# Patient Record
Sex: Male | Born: 1995 | Race: Black or African American | Hispanic: No | Marital: Single | Smoking: Never smoker
Health system: Southern US, Community
[De-identification: ages and names within clinical notes are randomized; demographics above are authoritative.]

---

## 2019-03-11 ENCOUNTER — Other Ambulatory Visit: Payer: Self-pay

## 2019-03-11 ENCOUNTER — Encounter (HOSPITAL_COMMUNITY): Payer: Self-pay | Admitting: *Deleted

## 2019-03-11 ENCOUNTER — Emergency Department (HOSPITAL_COMMUNITY)
Admission: EM | Admit: 2019-03-11 | Discharge: 2019-03-11 | Disposition: A | Discharge status: Home / Self Care | Payer: Self-pay | Attending: Emergency Medicine | Admitting: Emergency Medicine

## 2019-03-11 ENCOUNTER — Emergency Department (HOSPITAL_COMMUNITY): Payer: Self-pay

## 2019-03-11 DIAGNOSIS — J069 Acute upper respiratory infection, unspecified: Secondary | ICD-10-CM | POA: Insufficient documentation

## 2019-03-11 DIAGNOSIS — R509 Fever, unspecified: Secondary | ICD-10-CM | POA: Insufficient documentation

## 2019-03-11 DIAGNOSIS — R0789 Other chest pain: Secondary | ICD-10-CM | POA: Insufficient documentation

## 2019-03-11 LAB — BASIC METABOLIC PANEL
Anion gap: 12 (ref 5–15)
BUN: 9 mg/dL (ref 6–20)
CO2: 22 mmol/L (ref 22–32)
Calcium: 9.2 mg/dL (ref 8.9–10.3)
Chloride: 103 mmol/L (ref 98–111)
Creatinine, Ser: 0.74 mg/dL (ref 0.61–1.24)
GFR calc Af Amer: >60 mL/min (ref >60)
GFR calc non Af Amer: >60 mL/min (ref >60)
Glucose, Bld: 110 mg/dL — ABNORMAL HIGH (ref 70–99)
Potassium: 4.5 mmol/L (ref 3.5–5.1)
Sodium: 137 mmol/L (ref 135–145)

## 2019-03-11 LAB — CBC WITH DIFFERENTIAL/PLATELET
Abs Immature Granulocytes: 0.02 10*3/uL (ref 0.00–0.07)
Basophils Absolute: 0 10*3/uL (ref 0.0–0.1)
Basophils Relative: 0 %
Eosinophils Absolute: 0 10*3/uL (ref 0.0–0.5)
Eosinophils Relative: 0 %
HCT: 46.2 % (ref 39.0–52.0)
Hemoglobin: 15 g/dL (ref 13.0–17.0)
Immature Granulocytes: 1 %
Lymphocytes Relative: 27 %
Lymphs Abs: 1.2 10*3/uL (ref 0.7–4.0)
MCH: 28.9 pg (ref 26.0–34.0)
MCHC: 32.5 g/dL (ref 30.0–36.0)
MCV: 89 fL (ref 80.0–100.0)
Monocytes Absolute: 0.7 10*3/uL (ref 0.1–1.0)
Monocytes Relative: 15 %
Neutro Abs: 2.6 10*3/uL (ref 1.7–7.7)
Neutrophils Relative %: 57 %
Platelets: 161 10*3/uL (ref 150–400)
RBC: 5.19 MIL/uL (ref 4.22–5.81)
RDW: 12.9 % (ref 11.5–15.5)
WBC: 4.4 10*3/uL (ref 4.0–10.5)
nRBC: 0 % (ref 0.0–0.2)

## 2019-03-11 LAB — D-DIMER, QUANTITATIVE: D-Dimer, Quant: 0.3 ug/mL-FEU (ref 0.00–0.50)

## 2019-03-11 MED ORDER — DOXYCYCLINE HYCLATE 100 MG PO CAPS: 100.0000 mg | ORAL_CAPSULE | Freq: Two times a day (BID) | ORAL | 0 refills | Status: DC

## 2019-03-11 MED ORDER — SODIUM CHLORIDE 0.9 % IV BOLUS
500.0000 mL | Freq: Once | INTRAVENOUS | Status: AC
  Administered 2019-03-11: 500 mL via INTRAVENOUS

## 2019-03-11 MED ORDER — ACETAMINOPHEN 325 MG PO TABS: 650.0000 mg | ORAL_TABLET | Freq: Four times a day (QID) | ORAL | 0 refills | Status: DC | PRN

## 2019-03-11 MED ORDER — ACETAMINOPHEN 325 MG PO TABS
650.0000 mg | ORAL_TABLET | Freq: Once | ORAL | Status: AC
  Administered 2019-03-11: 650 mg via ORAL
  Filled 2019-03-11: qty 2

## 2019-03-11 MED ORDER — IBUPROFEN 600 MG PO TABS: 600.0000 mg | ORAL_TABLET | Freq: Four times a day (QID) | ORAL | 0 refills | Status: DC | PRN

## 2019-03-11 MED ORDER — ALBUTEROL SULFATE HFA 108 (90 BASE) MCG/ACT IN AERS
6.0000 | INHALATION_SPRAY | Freq: Once | RESPIRATORY_TRACT | Status: AC
  Administered 2019-03-11: 6 via RESPIRATORY_TRACT
  Filled 2019-03-11: qty 6.7

## 2019-03-11 NOTE — Discharge Instructions (Addendum)
You were given a prescription for antibiotics. Please take the antibiotic prescription fully.   Take two puff of the albuterol inhaler as needed for cough or shortness of breath.  Rotate tylenol and motrin for fevers.  Stay well hydrated.  You should be isolated for at least 7 days since the onset of your symptoms AND >72 hours after symptoms resolution (absence of fever without the use of fever reducing medication and improvement in respiratory symptoms), whichever is longer  Please follow up with your primary care provider within 5-7 days for re-evaluation of your symptoms. If you do not have a primary care provider, information for a healthcare clinic has been provided for you to make arrangements for follow up care. Please return to the emergency department for any new or worsening symptoms.  --------------------------------------------------------------------------------  Gary Fleet imiti ya antibiotike. [place] fata antibiyotike yuzuye.  Fata puffe ebyiri zo [place] albuterol nkuko bikenewe kugirango inkorora cyangwa [place]a neza.  Kuzenguruka tylenol na motrin Tia Alert.  Gumana amazi meza.  Ugomba kwigunga byibuze iminsi 7 kuva ibimenyetso byawe bitangiye KANDI> amasaha 59 nyuma yo [place] ibimenyetso ([place] umuriro udakoresheje umuriro [place] imiti no kunoza ibimenyetso byubuhumekero), ibyo bikaba birebire  [place] kurikirana nubuvuzi bwibanze mugihe cyiminsi 5-7 kugirango wongere usuzume ibimenyetso byawe. Niba udafite ubuvuzi bwibanze, amakuru yivuriro ryatanzwe [place] yo gukurikirana ubuvuzi. [place] subira mu ishami ryihutirwa kubimenyetso byose bishya cyangwa bibi.

## 2019-03-11 NOTE — ED Provider Notes (Signed)
[facility] EMERGENCY DEPARTMENT Provider Note   CSN: [id number] Arrival date & time: 03/11/19  1231    History   Chief Complaint Chief Complaint  Patient presents with  . Shortness of Breath    Phone interpretor was used throughout this evaluation.  HPI Jon Yates is a 23 y.o. male.     HPI   Patient is a 23 year old male with no significant past medical history presents the emergency department today for evaluation of cough, hemoptysis, chest pain/heaviness, pain with inspiration, shortness of breath, fatigue, and generalized weakness starting 5 days ago.  States that the cough has been intermittent.  States he has had hemoptysis at least 3 times this week.  Reports this chest pain/heaviness is on the left side of the chest.  It is made worse with inspiration.  He also states that he has lost his sense of taste and smell.  He denies any known fevers.  Has had some mild rhinorrhea/nasal congestion but attributes it to allergies.  He denies any lower extremity swelling or calf pain.  No recent periods of extended travel.  No recent hospital admissions or surgeries.  No history of VTE.  Denies any known sick contacts or known COVID exposures.  Patient moved here from [place] in October, states he is not up-to-date on vaccinations.  History reviewed. No pertinent past medical history.  There are no active problems to display for this patient.   History reviewed. No pertinent surgical history.    Home Medications    Prior to Admission medications   Medication Sig Start Date End Date Taking? Authorizing Provider  acetaminophen (TYLENOL) 325 MG tablet Take 2 tablets (650 mg total) by mouth every 6 (six) hours as needed. Do not take more than 4000mg  of tylenol per day 03/11/19   Melene Plan, DO  doxycycline (VIBRAMYCIN) 100 MG capsule Take 1 capsule (100 mg total) by mouth 2 (two) times daily for 7 days. 03/11/19 03/18/19  Melene Plan, DO  ibuprofen (ADVIL) 600  MG tablet Take 1 tablet (600 mg total) by mouth every 6 (six) hours as needed. 03/11/19   Melene Plan, DO    Family History No family history on file.  Social History Social History   Tobacco Use  . Smoking status: Never Smoker  . Smokeless tobacco: Never Used  Substance Use Topics  . Alcohol use: Yes  . Drug use: Never     Allergies   Patient has no allergy information on record.   Review of Systems Review of Systems  Constitutional: Positive for fatigue. Negative for fever.       Anosmia  HENT: Positive for congestion and rhinorrhea. Negative for sore throat.   Eyes: Negative for visual disturbance.  Respiratory: Positive for cough and shortness of breath.   Cardiovascular: Positive for chest pain. Negative for leg swelling.  Gastrointestinal: Negative for abdominal pain, constipation, diarrhea, nausea and vomiting.  Genitourinary: Negative for dysuria and hematuria.  Musculoskeletal: Negative for myalgias.  Skin: Negative for rash.  Neurological: Negative for headaches.  All other systems reviewed and are negative.    Physical Exam Updated Vital Signs BP 118/87 (BP Location: Right Arm)   Pulse 99   Temp (!) 101.1 F (38.4 C) (Oral)   Resp 16   Ht 5\' 5"  (1.651 m)   Wt 99.8 kg   SpO2 97%   BMI 36.61 kg/m   Physical Exam Vitals signs and nursing note reviewed.  Constitutional:      Appearance: He  is well-developed.  HENT:     Head: Normocephalic and atraumatic.  Eyes:     Conjunctiva/sclera: Conjunctivae normal.  Neck:     Musculoskeletal: Neck supple.  Cardiovascular:     Rate and Rhythm: Regular rhythm. Tachycardia present.     Heart sounds: Normal heart sounds. No murmur.  Pulmonary:     Effort: Pulmonary effort is normal. No respiratory distress.     Breath sounds: Rhonchi present. No decreased breath sounds, wheezing or rales.     Comments: Wet cough on exam. Tachypneic, but able to speak in full sentences. Abdominal:     General: Bowel sounds  are normal.     Palpations: Abdomen is soft.     Tenderness: There is no abdominal tenderness.  Musculoskeletal:     Right lower leg: He exhibits no tenderness. No edema.     Left lower leg: He exhibits no tenderness. No edema.  Skin:    General: Skin is warm and dry.  Neurological:     Mental Status: He is alert.      ED Treatments / Results  Labs (all labs ordered are listed, but only abnormal results are displayed) Labs Reviewed  BASIC METABOLIC PANEL - Abnormal; Notable for the following components:      Result Value   Glucose, Bld 110 (*)    All other components within normal limits  CBC WITH DIFFERENTIAL/PLATELET  D-DIMER, QUANTITATIVE (NOT AT [facility])    EKG EKG Interpretation  Date/Time:  Friday March 11 2019 12:45:01 EDT Ventricular Rate:  97 PR Interval:    QRS Duration: 77 QT Interval:  312 QTC Calculation: 397 R Axis:   56 Text Interpretation:  Sinus rhythm No old tracing to compare Confirmed by Melene PlanFloyd, Dan [phone]([id number]) on 03/11/2019 1:30:05 PM   Radiology Dg Chest Portable 1 View  Result Date: 03/11/2019 CLINICAL DATA:  Shortness of breath with cough and fever EXAM: PORTABLE CHEST 1 VIEW COMPARISON:  None. FINDINGS: There is subtle increased opacity in the right base. The lungs elsewhere are clear. Heart size and pulmonary vascularity are normal. No adenopathy. No bone lesions. IMPRESSION: Subtle increased opacity in the right base, suspicious for early pneumonia. Lungs elsewhere clear. No adenopathy. Electronically Signed   By: Bretta BangWilliam  Woodruff III M.D.   On: 03/11/2019 14:13    Procedures Procedures (including critical care time)  Medications Ordered in ED Medications  acetaminophen (TYLENOL) tablet 650 mg (650 mg Oral Given 03/11/19 1425)  albuterol (VENTOLIN HFA) 108 (90 Base) MCG/ACT inhaler 6 puff (6 puffs Inhalation Given 03/11/19 1425)  sodium chloride 0.9 % bolus 500 mL (0 mLs Intravenous Stopped 03/11/19 1500)     Initial Impression / Assessment and  Plan / ED Course  I have reviewed the triage vital signs and the nursing notes.  Pertinent labs & imaging results that were available during my care of the patient were reviewed by me and considered in my medical decision making (see chart for details).     Final Clinical Impressions(s) / ED Diagnoses   Final diagnoses:  Upper respiratory tract infection, unspecified type   Pt presenting with cough, blood in sputum, and sob x1 week.   On arrival, pt noted to be febrile to 101F. Also with marginal tachycardia and tachypnea. Sats are WNL and BP is stable.  No risk factors for VTE. No known covid contacts.   Lungs CTAB, but pt has wet cough on exam. Mild tachypnea, but able to speak in complete sentences. No peripheral edema. Will obtain  labs including ddimer and obtain CXR. EKG completed from triage.  CBC is without leukocytosis, no anemia present BMP with normal electrolytes and kidney function Ddimer is negative making PE much less likely  EKG with NSR, no ischemic changes  CXR with subtle increased opacity in the right base, suspicious for early pneumonia  Given the finding on his CXR I will give him an rx for doxycycline to cover possible bacterial pneumonia. I do have increased suspicion for COVID in this patient and we discussed this and the need for him to self quarantine. Pt given tylenol, IVF, and albuterol inhaler. He states his sob and cp/tightness have improved on re-eval. Pt continues to be able to speak in full sentences throughout my interview and is in no distress. I will give him rx for doxy, albuterol and antipyretics. Gave strict instructions on return precautions. He voices understanding and is in agreement with plan. All questions answered. Pt stable for d/c.  Jon Yates was evaluated in Emergency Department on 03/12/2019 for the symptoms described in the history of present illness. He was evaluated in the context of the global COVID-19 pandemic, which  necessitated consideration that the patient might be at risk for infection with the SARS-CoV-2 virus that causes COVID-19. Institutional protocols and algorithms that pertain to the evaluation of patients at risk for COVID-19 are in a state of rapid change based on information released by regulatory bodies including the CDC and federal and state organizations. These policies and algorithms were followed during the patient's care in the ED.   ED Discharge Orders         Ordered    doxycycline (VIBRAMYCIN) 100 MG capsule  2 times daily,   Status:  Discontinued     03/11/19 1603    acetaminophen (TYLENOL) 325 MG tablet  Every 6 hours PRN,   Status:  Discontinued     03/11/19 1603    ibuprofen (ADVIL) 600 MG tablet  Every 6 hours PRN,   Status:  Discontinued     03/11/19 1603    acetaminophen (TYLENOL) 325 MG tablet  Every 6 hours PRN     03/11/19 1712    doxycycline (VIBRAMYCIN) 100 MG capsule  2 times daily     03/11/19 1712    ibuprofen (ADVIL) 600 MG tablet  Every 6 hours PRN     03/11/19 1712           Sarann Tregre S, PA-C 03/12/19 0845    Melene Plan, DO 03/12/19 1117

## 2019-03-11 NOTE — ED Triage Notes (Signed)
Patient speaks" [place]" language not available on interrupter. Speaks English however it is broken. C/o chest pain and feels like he can't get a deep breathe in. Denies cough.

## 2019-03-13 ENCOUNTER — Encounter (HOSPITAL_COMMUNITY): Payer: Self-pay | Admitting: *Deleted

## 2019-03-13 ENCOUNTER — Other Ambulatory Visit: Payer: Self-pay

## 2019-03-13 ENCOUNTER — Emergency Department (HOSPITAL_COMMUNITY): Payer: HRSA Program

## 2019-03-13 ENCOUNTER — Inpatient Hospital Stay (HOSPITAL_COMMUNITY)
Admission: EM | Admit: 2019-03-13 | Discharge: 2019-03-18 | DRG: 177 | Disposition: A | Discharge status: Home / Self Care | Payer: HRSA Program | Attending: Internal Medicine | Admitting: Internal Medicine

## 2019-03-13 DIAGNOSIS — J1289 Other viral pneumonia: Secondary | ICD-10-CM | POA: Diagnosis present

## 2019-03-13 DIAGNOSIS — Z79899 Other long term (current) drug therapy: Secondary | ICD-10-CM

## 2019-03-13 DIAGNOSIS — R0682 Tachypnea, not elsewhere classified: Secondary | ICD-10-CM

## 2019-03-13 DIAGNOSIS — J988 Other specified respiratory disorders: Secondary | ICD-10-CM | POA: Diagnosis not present

## 2019-03-13 DIAGNOSIS — J9621 Acute and chronic respiratory failure with hypoxia: Secondary | ICD-10-CM | POA: Diagnosis present

## 2019-03-13 DIAGNOSIS — R6510 Systemic inflammatory response syndrome (SIRS) of non-infectious origin without acute organ dysfunction: Secondary | ICD-10-CM | POA: Diagnosis present

## 2019-03-13 DIAGNOSIS — J069 Acute upper respiratory infection, unspecified: Secondary | ICD-10-CM | POA: Diagnosis not present

## 2019-03-13 DIAGNOSIS — A419 Sepsis, unspecified organism: Secondary | ICD-10-CM

## 2019-03-13 DIAGNOSIS — R079 Chest pain, unspecified: Secondary | ICD-10-CM

## 2019-03-13 LAB — COMPREHENSIVE METABOLIC PANEL
ALT: 20 U/L (ref 0–44)
AST: 22 U/L (ref 15–41)
Albumin: 3.7 g/dL (ref 3.5–5.0)
Alkaline Phosphatase: 57 U/L (ref 38–126)
Anion gap: 9 (ref 5–15)
BUN: 9 mg/dL (ref 6–20)
CO2: 26 mmol/L (ref 22–32)
Calcium: 8.8 mg/dL — ABNORMAL LOW (ref 8.9–10.3)
Chloride: 102 mmol/L (ref 98–111)
Creatinine, Ser: 0.85 mg/dL (ref 0.61–1.24)
GFR calc Af Amer: >60 mL/min (ref >60)
GFR calc non Af Amer: >60 mL/min (ref >60)
Glucose, Bld: 133 mg/dL — ABNORMAL HIGH (ref 70–99)
Potassium: 4.4 mmol/L (ref 3.5–5.1)
Sodium: 137 mmol/L (ref 135–145)
Total Bilirubin: 0.5 mg/dL (ref 0.3–1.2)
Total Protein: 7.9 g/dL (ref 6.5–8.1)

## 2019-03-13 LAB — CBC WITH DIFFERENTIAL/PLATELET
Abs Immature Granulocytes: 0.02 10*3/uL (ref 0.00–0.07)
Basophils Absolute: 0 10*3/uL (ref 0.0–0.1)
Basophils Relative: 0 %
Eosinophils Absolute: 0 10*3/uL (ref 0.0–0.5)
Eosinophils Relative: 0 %
HCT: 45.3 % (ref 39.0–52.0)
Hemoglobin: 14.3 g/dL (ref 13.0–17.0)
Immature Granulocytes: 0 %
Lymphocytes Relative: 28 %
Lymphs Abs: 1.4 10*3/uL (ref 0.7–4.0)
MCH: 28.8 pg (ref 26.0–34.0)
MCHC: 31.6 g/dL (ref 30.0–36.0)
MCV: 91.3 fL (ref 80.0–100.0)
Monocytes Absolute: 0.6 10*3/uL (ref 0.1–1.0)
Monocytes Relative: 12 %
Neutro Abs: 2.9 10*3/uL (ref 1.7–7.7)
Neutrophils Relative %: 60 %
Platelets: 202 10*3/uL (ref 150–400)
RBC: 4.96 MIL/uL (ref 4.22–5.81)
RDW: 13.1 % (ref 11.5–15.5)
WBC: 4.9 10*3/uL (ref 4.0–10.5)
nRBC: 0 % (ref 0.0–0.2)

## 2019-03-13 LAB — SARS CORONAVIRUS 2 BY RT PCR (HOSPITAL ORDER, PERFORMED IN ~~LOC~~ HOSPITAL LAB): SARS Coronavirus 2: POSITIVE — AB

## 2019-03-13 LAB — PROCALCITONIN: Procalcitonin: <0.1 ng/mL

## 2019-03-13 LAB — TRIGLYCERIDES: Triglycerides: 134 mg/dL (ref <150)

## 2019-03-13 LAB — LACTIC ACID, PLASMA
Lactic Acid, Venous: 1.6 mmol/L (ref 0.5–1.9)
Lactic Acid, Venous: 1.4 mmol/L (ref 0.5–1.9)

## 2019-03-13 LAB — FERRITIN: Ferritin: 286 ng/mL (ref 24–336)

## 2019-03-13 LAB — FIBRINOGEN: Fibrinogen: 542 mg/dL — ABNORMAL HIGH (ref 210–475)

## 2019-03-13 LAB — RAPID HIV SCREEN (HIV 1/2 AB+AG)
HIV 1/2 Antibodies: NONREACTIVE
HIV-1 P24 Antigen - HIV24: NONREACTIVE

## 2019-03-13 LAB — D-DIMER, QUANTITATIVE: D-Dimer, Quant: <0.27 ug/mL-FEU (ref 0.00–0.50)

## 2019-03-13 LAB — LACTATE DEHYDROGENASE: LDH: 219 U/L — ABNORMAL HIGH (ref 98–192)

## 2019-03-13 LAB — C-REACTIVE PROTEIN: CRP: 6 mg/dL — ABNORMAL HIGH (ref <1.0)

## 2019-03-13 MED ORDER — IPRATROPIUM BROMIDE HFA 17 MCG/ACT IN AERS
2.0000 | INHALATION_SPRAY | RESPIRATORY_TRACT | Status: DC
  Administered 2019-03-13 – 2019-03-17 (×21): 2 via RESPIRATORY_TRACT
  Filled 2019-03-13: qty 12.9

## 2019-03-13 MED ORDER — DM-GUAIFENESIN ER 30-600 MG PO TB12
1.0000 | ORAL_TABLET | Freq: Two times a day (BID) | ORAL | Status: DC | PRN
  Administered 2019-03-14 – 2019-03-16 (×4): 1 via ORAL
  Filled 2019-03-13 (×5): qty 1

## 2019-03-13 MED ORDER — SODIUM CHLORIDE 0.9 % IV SOLN
INTRAVENOUS | Status: DC
  Administered 2019-03-14 – 2019-03-15 (×5): via INTRAVENOUS

## 2019-03-13 MED ORDER — AZITHROMYCIN 250 MG PO TABS
250.0000 mg | ORAL_TABLET | Freq: Every day | ORAL | Status: DC
  Administered 2019-03-14: 20:00:00 250 mg via ORAL
  Filled 2019-03-13 (×2): qty 1

## 2019-03-13 MED ORDER — ONDANSETRON HCL 4 MG/2ML IJ SOLN: 4.0000 mg | Freq: Four times a day (QID) | INTRAMUSCULAR | Status: DC | PRN

## 2019-03-13 MED ORDER — ALBUTEROL SULFATE HFA 108 (90 BASE) MCG/ACT IN AERS
2.0000 | INHALATION_SPRAY | RESPIRATORY_TRACT | Status: DC | PRN
  Administered 2019-03-14 – 2019-03-17 (×4): 2 via RESPIRATORY_TRACT
  Filled 2019-03-13: qty 6.7

## 2019-03-13 MED ORDER — AZITHROMYCIN 250 MG PO TABS
500.0000 mg | ORAL_TABLET | Freq: Every day | ORAL | Status: AC
  Administered 2019-03-13: 23:00:00 500 mg via ORAL
  Filled 2019-03-13: qty 2

## 2019-03-13 MED ORDER — ONDANSETRON HCL 4 MG PO TABS
4.0000 mg | ORAL_TABLET | Freq: Four times a day (QID) | ORAL | Status: DC | PRN
  Administered 2019-03-16: 4 mg via ORAL
  Filled 2019-03-13: qty 1

## 2019-03-13 MED ORDER — ACETAMINOPHEN 325 MG PO TABS
650.0000 mg | ORAL_TABLET | Freq: Four times a day (QID) | ORAL | Status: DC | PRN
  Administered 2019-03-14 (×3): 650 mg via ORAL
  Filled 2019-03-13 (×4): qty 2

## 2019-03-13 MED ORDER — ACETAMINOPHEN 650 MG RE SUPP: 650.0000 mg | Freq: Four times a day (QID) | RECTAL | Status: DC | PRN

## 2019-03-13 MED ORDER — ENOXAPARIN SODIUM 40 MG/0.4ML ~~LOC~~ SOLN: 40.0000 mg | SUBCUTANEOUS | Status: DC

## 2019-03-13 NOTE — ED Notes (Signed)
Date and time results received: 03/13/19 1914 (use smartphrase ".now" to insert current time)  Test: Covid 19 Critical Value: positive  Name of Provider Notified: Lyndel Safe  Orders Received? Or Actions Taken?: reported to Lyndel Safe of positive covid 19 test

## 2019-03-13 NOTE — ED Notes (Addendum)
Pt placed on 2L nasal cannula, o2 sats around 94-95%

## 2019-03-13 NOTE — ED Notes (Signed)
Hospitalist at bedside 

## 2019-03-13 NOTE — ED Provider Notes (Signed)
[facility]-EMERGENCY DEPT Provider Note   CSN: [id number] Arrival date & time: 03/13/19  1459    History   Chief Complaint Chief Complaint  Patient presents with  . Cough    HPI Jon Yates is a 23 y.o. male with no significant past medical history who presents today for evaluation of cough, hemoptysis, chest pain/heaviness, pain with inspiration, shortness of breath, fatigue and generally not feeling well.  This started on 03/06/2019.  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  He was seen for this on 03/11/2019, at that point his chest x-ray showed concern for a pneumonia and he was started on doxycycline.  D-dimer was not elevated.  He moved here from [place] in August.  He is not up-to-date on vaccines.  He reports that he has vomited once however does not have any abdominal pain.  He reports that he took both ibuprofen and Tylenol prior to arrival.  He reports compliance with his albuterol, antibiotics, and ibuprofen/Tylenol.  He reports that someone else at home is sick with something similar however they are not anywhere near as sick as he is.  Audio interpreter through [employer] interpreter was used to communicate with patient.     HPI  History reviewed. No pertinent past medical history.  There are no active problems to display for this patient.   History reviewed. No pertinent surgical history.      Home Medications    Prior to Admission medications   Medication Sig Start Date End Date Taking? Authorizing Provider  acetaminophen (TYLENOL) 325 MG tablet Take 2 tablets (650 mg total) by mouth every 6 (six) hours as needed. Do not take more than 4000mg  of tylenol per day 03/11/19   Melene PlanFloyd, Dan, DO  doxycycline (VIBRAMYCIN) 100 MG capsule Take 1 capsule (100 mg total) by mouth 2 (two) times daily for 7 days. 03/11/19 03/18/19  Melene PlanFloyd, Dan, DO  ibuprofen (ADVIL) 600 MG tablet Take 1 tablet (600 mg total) by mouth every 6 (six) hours as needed. 03/11/19    Melene PlanFloyd, Dan, DO    Family History No family history on file.  Social History Social History   Tobacco Use  . Smoking status: Never Smoker  . Smokeless tobacco: Never Used  Substance Use Topics  . Alcohol use: Yes  . Drug use: Never     Allergies   Patient has no known allergies.   Review of Systems Review of Systems  Constitutional: Positive for appetite change (Loss of taste/smell.), chills, fatigue and fever.  HENT: Positive for congestion and sore throat.   Respiratory: Positive for cough, chest tightness and shortness of breath.   Cardiovascular: Positive for chest pain.  Gastrointestinal: Positive for vomiting. Negative for abdominal pain and nausea.  Genitourinary: Negative for dysuria.  Musculoskeletal: Negative for back pain and neck pain.  Skin: Negative for rash.  Neurological: Negative for weakness, numbness and headaches.  All other systems reviewed and are negative.    Physical Exam Updated Vital Signs BP (!) 143/102   Pulse 83   Temp 100 F (37.8 C) (Oral)   Resp (!) 29   Ht 5\' 5"  (1.651 m)   Wt 99.7 kg   SpO2 97%   BMI 36.58 kg/m   Physical Exam Vitals signs and nursing note reviewed.  Constitutional:      Appearance: He is well-developed. He is diaphoretic (Mild).  HENT:     Head: Normocephalic and atraumatic.     Mouth/Throat:  Mouth: Mucous membranes are moist.  Eyes:     General: No scleral icterus.       Right eye: No discharge.        Left eye: No discharge.     Conjunctiva/sclera: Conjunctivae normal.  Neck:     Musculoskeletal: Normal range of motion and neck supple. No neck rigidity.  Cardiovascular:     Rate and Rhythm: Regular rhythm. Tachycardia present.     Pulses: Normal pulses.     Heart sounds: Normal heart sounds.  Pulmonary:     Effort: Pulmonary effort is normal. Tachypnea present. No accessory muscle usage or respiratory distress.     Breath sounds: Normal breath sounds. No stridor. No decreased breath sounds,  wheezing, rhonchi or rales.  Abdominal:     General: Bowel sounds are normal. There is no distension.     Tenderness: There is no abdominal tenderness. There is no guarding or rebound.  Musculoskeletal:        General: No deformity.     Right lower leg: No edema.     Left lower leg: No edema.  Skin:    General: Skin is warm.  Neurological:     General: No focal deficit present.     Mental Status: He is alert and oriented to person, place, and time.     Motor: No abnormal muscle tone.  Psychiatric:        Mood and Affect: Mood normal.        Behavior: Behavior normal.      ED Treatments / Results  Labs (all labs ordered are listed, but only abnormal results are displayed) Labs Reviewed  SARS CORONAVIRUS 2 (HOSPITAL ORDER, PERFORMED IN [facility]) - Abnormal; Notable for the following components:      Result Value   SARS Coronavirus 2 POSITIVE (*)    All other components within normal limits  COMPREHENSIVE METABOLIC PANEL - Abnormal; Notable for the following components:   Glucose, Bld 133 (*)    Calcium 8.8 (*)    All other components within normal limits  LACTATE DEHYDROGENASE - Abnormal; Notable for the following components:   LDH 219 (*)    All other components within normal limits  FIBRINOGEN - Abnormal; Notable for the following components:   Fibrinogen 542 (*)    All other components within normal limits  C-REACTIVE PROTEIN - Abnormal; Notable for the following components:   CRP 6.0 (*)    All other components within normal limits  CULTURE, BLOOD (ROUTINE X 2)  CULTURE, BLOOD (ROUTINE X 2)  LACTIC ACID, PLASMA  LACTIC ACID, PLASMA  CBC WITH DIFFERENTIAL/PLATELET  D-DIMER, QUANTITATIVE (NOT AT [facility])  PROCALCITONIN  FERRITIN  TRIGLYCERIDES  RAPID HIV SCREEN (HIV 1/2 AB+AG)  GLUCOSE 6 PHOSPHATE DEHYDROGENASE  QUANTIFERON-TB GOLD PLUS    EKG EKG Interpretation  Date/Time:  Sunday March 13 2019 15:55:16 EDT Ventricular Rate:  95 PR Interval:     QRS Duration: 79 QT Interval:  318 QTC Calculation: 400 R Axis:   56 Text Interpretation:  Sinus rhythm Ventricular premature complex Confirmed by Benjiman Core [phone]) on 03/13/2019 4:12:18 PM   Radiology Dg Chest Port 1 View  Result Date: 03/13/2019 CLINICAL DATA:  Shortness of breath and cough EXAM: PORTABLE CHEST 1 VIEW COMPARISON:  March 11, 2019 FINDINGS: Stable cardiomegaly. No pneumothorax. No pulmonary nodules or masses. No focal infiltrates. Mild bibasilar atelectasis. IMPRESSION: No active disease. Electronically Signed   By: Gerome Sam III M.D   On:  03/13/2019 16:23    Procedures Procedures (including critical care time)  Medications Ordered in ED Medications - No data to display   Initial Impression / Assessment and Plan / ED Course  I have reviewed the triage vital signs and the nursing notes.  Pertinent labs & imaging results that were available during my care of the patient were reviewed by me and considered in my medical decision making (see chart for details).  Clinical Course as of Mar 13 1947  Sun Mar 13, 2019  1610 In room patient is breathing at approximately 35-40 times per minute with saturations in the 92-93 with good waveform.   [EH]  1631 Spoke with On call Dr. Lorenso Courier for ID He recommended adding QuantiFERON gold.  He request to add G6PD testing.  Recommends that when starting hydroxychloroquine to look for hemolysis. Start with 400 BID for one day then taper down to 200 BID.    [EH]  1930 RR improved with 2 liters oxygen [place]  Resp(!): 29 [EH]  1931 SARS Coronavirus 2 [facility] order, Performed in [facility] hospital lab)(!) [EH]  1948 Spoke with Dr. Clyde Lundborg who will admit patient.    [EH]    Clinical Course User Index [EH] Cristina Gong, PA-C      Patient presents today for evaluation of cough, chest pain/heaviness, shortness of breath and generally feeling unwell.  He was seen 2 days ago and diagnosed with right-sided pneumonia on  chest x-ray and started with doxycycline.  He reports compliance with these however he is having more pain and feeling short of breath.  He moved here from [place] in August.  Based on his history of arriving from [place] in the past year I spoke with on-call for ID Dr. Lorenso Courier.  HIV test and QuantiFERON gold were both ordered.  He requested that I send test for G6PD deficiency and said that 1 started on hydroxychloroquine patient will need to be closely monitored for hemolysis.    Repeat chest x-ray was obtained which did not show any evidence of pneumonia or consolidation.  Given his degree of tachypnea with respiratory rates into the 40s with a known fever, and borderline hypoxia concern for COVID-19.  Rapid test was sent which returned positive.  Blood cultures were obtained.  Lactic acid is not elevated.  Orders were placed using the ED preadmission coronavirus test, through which d-dimer, fibrinogen, pro calcitonin, ferritin, and additional tests were ordered.  D-dimer remains normal.  His procalcitonin is not elevated.  Rapid HIV test was nonreactive. CRP and LDH were both slightly elevated.  He does not have significant transaminitis or leukopenia.  His respiratory rate went as high as 43, after this he was placed on 2 L of oxygen by nasal cannula with improvement in his respiratory rate into the mid 20s to low 30s.  After this he appeared more comfortable.  Given his degree of tachypnea, and improvement with oxygen I feel that patient will benefit from admission for continued oxygen and monitoring.  I spoke with Dr. Clyde Lundborg who agreed to admit the patient.  Aydrien Froman Treptow was evaluated in Emergency Department on 03/13/2019 for the symptoms described in the history of present illness. He was evaluated in the context of the global COVID-19 pandemic, which necessitated consideration that the patient might be at risk for infection with the SARS-CoV-2 virus that causes COVID-19. Institutional protocols  and algorithms that pertain to the evaluation of patients at risk for COVID-19 are in a state of rapid change  based on information released by regulatory bodies including the CDC and federal and state organizations. These policies and algorithms were followed during the patient's care in the ED.   Final Clinical Impressions(s) / ED Diagnoses   Final diagnoses:  2019 novel coronavirus disease (COVID-19)  Tachypnea    ED Discharge Orders    None       Norman Clay 03/13/19 2046    Benjiman Core, MD 03/16/19 [phone]

## 2019-03-13 NOTE — ED Notes (Signed)
Patient given sandwich, cheese stick, and water. 

## 2019-03-13 NOTE — ED Notes (Signed)
ED Provider at bedside. 

## 2019-03-13 NOTE — ED Triage Notes (Signed)
Pt is poor historian, seen at [facility] on the 17th for [facility] and cough, dx URI and started on Doxy, pt states he is not getting better.

## 2019-03-13 NOTE — ED Notes (Signed)
XR at bedside

## 2019-03-13 NOTE — H&P (Addendum)
History and Physical    Jon Yates WVP:[id number] DOB: April 24, 1996 DOA: 03/13/2019  Referring MD/NP/PA:   PCP: Patient, No Pcp Per   Patient coming from:  The patient is coming from home.  At baseline, pt is independent for most of ADL.        Chief Complaint: Fever, chills, cough, shortness of breath, chest pain, hemoptysis, generalized weakness.  HPI: Jon Yates NIOEVOJJK is a 23 y.o. male without significant medical history, recently moved to [place] from [place] 08/2028, presents with fever, chills, cough, shortness of breath, chest pain, mouth disease, generalized weakness.  Pt speaks little [place] and his native language he is Kiny[place], need iPad translation. Pt states that he has bee sick for about a week.  Symptoms include fever, chills, cough, shortness of breath, chest pain, hemoptysis, generalized weakness.  Mostly he has dry cough.  He has mild pleuritic chest pain in central chest, aggravated by coughing and deep breath.  Patient also has mild intermittent hemoptysis.  Patient is a healthy young man, but he feels tired all the time.  He has nausea and vomited once, but currently no nausea, vomiting or abdominal pain.  No symptoms of UTI or unilateral weakness. Of note, pt was seen in ED on 4/17 and diagnosed as possible CAP and started pt on doxycycline without significant help.  ED Course: pt was found to have positive COVID-19 test, negative d-dimer, WBC 4.9, no lymphopenia, normal liver function, electrolytes renal function okay, CRP 6, LDH 219, procalcitonin less than 0.010, ferritin 286, fibrinogen 542, triglyceride 134, negative HIV antibody test, temperature 100, tachycardia, tachypnea, oxygen saturation 93 to 97% on 2 L nasal cannula oxygen.  Pending G6PD test. Chest x-ray negative.  Patient is admitted to telemetry bed as inpatient.  Review of Systems:    General: has fevers, chills, no body weight gain, has poor appetite, has fatigue HEENT: no blurry vision, hearing  changes or sore throat Respiratory: has dyspnea, coughing, no wheezing CV: has chest pain, no palpitations GI: had nausea, vomiting, no abdominal pain, diarrhea, constipation GU: no dysuria, burning on urination, increased urinary frequency, hematuria  Ext: no leg edema Neuro: no unilateral weakness, numbness, or tingling, no vision change or hearing loss Skin: no rash, no skin tear. MSK: No muscle spasm, no deformity, no limitation of range of movement in spin Heme: No easy bruising.  Travel history: Recently moved to [place] from [place] 08/2028.  Allergy: No Known Allergies  History reviewed. No pertinent past medical history.   History reviewed. No pertinent surgical history.    Social History:  reports that he has never smoked. He has never used smokeless tobacco. He reports current alcohol use. He reports that he does not use drugs.  Family History: Reviewed the patient, patient states that mother died of an accident, otherwise family members do not have significant medical issues.   Prior to Admission medications   Medication Sig Start Date End Date Taking? Authorizing Provider  acetaminophen (TYLENOL) 325 MG tablet Take 2 tablets (650 mg total) by mouth every 6 (six) hours as needed. Do not take more than 4000mg  of tylenol per day 03/11/19   Melene Plan, DO  doxycycline (VIBRAMYCIN) 100 MG capsule Take 1 capsule (100 mg total) by mouth 2 (two) times daily for 7 days. 03/11/19 03/18/19  Melene Plan, DO  ibuprofen (ADVIL) 600 MG tablet Take 1 tablet (600 mg total) by mouth every 6 (six) hours as needed. 03/11/19   Melene Plan, DO    Physical  Exam: Vitals:   03/13/19 1915 03/13/19 2015 03/13/19 2030 03/13/19 2100  BP: (!) 143/102 (!) 148/103 114/74 (!) 139/95  Pulse: 83 81 81 80  Resp: (!) 29 (!) 22 (!) 33 (!) 32  Temp:      TempSrc:      SpO2: 97% 99% 97% 97%  Weight:      Height:       General: Not in acute distress HEENT:       Eyes: PERRL, EOMI, no scleral icterus.        ENT: No discharge from the ears and nose, no pharynx injection, no tonsillar enlargement.        Neck: No JVD, no bruit, no mass felt. Heme: No neck lymph node enlargement. Cardiac: S1/S2, RRR, No murmurs, No gallops or rubs. Respiratory: No rales, wheezing, rhonchi or rubs. GI: Soft, nondistended, nontender, no rebound pain, no organomegaly, BS present. GU: No hematuria Ext: No pitting leg edema bilaterally. 2+DP/PT pulse bilaterally. Musculoskeletal: No joint deformities, No joint redness or warmth, no limitation of ROM in spin. Skin: No rashes.  Neuro: Alert, oriented X3, cranial nerves II-XII grossly intact, moves all extremities normally. Psych: Patient is not psychotic, no suicidal or hemocidal ideation.  Labs on Admission: I have personally reviewed following labs and imaging studies  CBC: Recent Labs  Lab 03/11/19 1339 03/13/19 1619  WBC 4.4 4.9  NEUTROABS 2.6 2.9  HGB 15.0 14.3  HCT 46.2 45.3  MCV 89.0 91.3  PLT 161 202   Basic Metabolic Panel: Recent Labs  Lab 03/11/19 1339 03/13/19 1619  NA 137 137  K 4.5 4.4  CL 103 102  CO2 22 26  GLUCOSE 110* 133*  BUN 9 9  CREATININE 0.74 0.85  CALCIUM 9.2 8.8*   GFR: Estimated Creatinine Clearance: 148.1 mL/min (by C-G formula based on SCr of 0.85 mg/dL). Liver Function Tests: Recent Labs  Lab 03/13/19 1619  AST 22  ALT 20  ALKPHOS 57  BILITOT 0.5  PROT 7.9  ALBUMIN 3.7   No results for input(s): LIPASE, AMYLASE in the last 168 hours. No results for input(s): AMMONIA in the last 168 hours. Coagulation Profile: No results for input(s): INR, PROTIME in the last 168 hours. Cardiac Enzymes: No results for input(s): CKTOTAL, CKMB, CKMBINDEX, TROPONINI in the last 168 hours. BNP (last 3 results) No results for input(s): PROBNP in the last 8760 hours. HbA1C: No results for input(s): HGBA1C in the last 72 hours. CBG: No results for input(s): GLUCAP in the last 168 hours. Lipid Profile: Recent Labs     03/13/19 1619  TRIG 134   Thyroid Function Tests: No results for input(s): TSH, T4TOTAL, FREET4, T3FREE, THYROIDAB in the last 72 hours. Anemia Panel: Recent Labs    03/13/19 1619  FERRITIN 286   Urine analysis: No results found for: COLORURINE, APPEARANCEUR, LABSPEC, PHURINE, GLUCOSEU, HGBUR, BILIRUBINUR, KETONESUR, PROTEINUR, UROBILINOGEN, NITRITE, LEUKOCYTESUR Sepsis Labs: (procalcitonin:4,lacticidven:4) ) Recent Results (from the past 240 hour(s))  SARS Coronavirus 2 [facility] order, Performed in [facility] hospital lab)     Status: Abnormal   Collection Time: 03/13/19  4:19 PM  Result Value Ref Range Status   SARS Coronavirus 2 POSITIVE (A) NEGATIVE Final    Comment: RESULT CALLED TO, READ BACK BY AND VERIFIED WITH: B.JESSEE,RN [id number]  BY V.WILKINS (NOTE) If result is NEGATIVE SARS-CoV-2 target nucleic acids are NOT DETECTED. The SARS-CoV-2 RNA is generally detectable in upper and lower  respiratory specimens during the acute phase of infection. The lowest  concentration of SARS-CoV-2 viral copies this assay can detect is 250  copies / mL. A negative result does not preclude SARS-CoV-2 infection  and should not be used as the sole basis for treatment or other  patient management decisions.  A negative result may occur with  improper specimen collection / handling, submission of specimen other  than nasopharyngeal swab, presence of viral mutation(s) within the  areas targeted by this assay, and inadequate number of viral copies  (<250 copies / mL). A negative result must be combined with clinical  observations, patient history, and epidemiological information. If result is POSITIVE SARS-CoV-2 target nucleic acids are DETECTED.  The SARS-CoV-2 RNA is generally detectable in upper and lower  respiratory specimens during the acute phase of infection.  Positive  results are indicative of active infection with SARS-CoV-2.  Clinical  correlation with patient  history and other diagnostic information is  necessary to determine patient infection status.  Positive results do  not rule out bacterial infection or co-infection with other viruses. If result is PRESUMPTIVE POSTIVE SARS-CoV-2 nucleic acids MAY BE PRESENT.   A presumptive positive result was obtained on the submitted specimen  and confirmed on repeat testing.  While 2019 novel coronavirus  (SARS-CoV-2) nucleic acids may be present in the submitted sample  additional confirmatory testing may be necessary for epidemiological  and / or clinical management purposes  to differentiate between  SARS-CoV-2 and other Sarbecovirus currently known to infect humans.  If clinically indicated additional testing with an alternate test  methodology [phone]([id number])  is advised. The SARS-CoV-2 RNA is generally  detectable in upper and lower respiratory specimens during the acute  phase of infection. The expected result is Negative. Fact Sheet for Patients:  BoilerBrush.com.cyhttps://www.fda.gov/media/136312/download Fact Sheet for Healthcare Providers: https://pope.com/https://www.fda.gov/media/136313/download This test is not yet approved or cleared by the [place] FDA and has been authorized for detection and/or diagnosis of SARS-CoV-2 by FDA under an Emergency Use Authorization (EUA).  This EUA will remain in effect (meaning this test can be used) for the duration of the COVID-19 declaration under Section 564(b)(1) of the Act, 21 U.S.C. section 360bbb-3(b)(1), unless the authorization is terminated or revoked sooner. Performed at [facility], [address] [place], [place] [address]      Radiological Exams on Admission: Dg Chest Port 1 View  Result Date: 03/13/2019 CLINICAL DATA:  Shortness of breath and cough EXAM: PORTABLE CHEST 1 VIEW COMPARISON:  March 11, 2019 FINDINGS: Stable cardiomegaly. No pneumothorax. No pulmonary nodules or masses. No focal infiltrates. Mild bibasilar atelectasis. IMPRESSION: No  active disease. Electronically Signed   By: Gerome Samavid  Williams III M.D   On: 03/13/2019 16:23     EKG: Independently reviewed.  Sinus rhythm, QTC 400, early R wave progression, LAE, nonspecific T wave change.  Assessment/Plan Principal Problem:   Acute respiratory disease due to COVID-19 virus Active Problems:   Sepsis (HCC)   Sepsis due to acute respiratory disease due to COVID-19 virus: EDP spoke with on-call for ID, Dr. Lorenso CourierPowers, " HIV test and QuantiFERON gold were both ordered.  He requested that I send test for G6PD deficiency and said that when started on hydroxychloroquine patient will need to be closely monitored for hemolysis".  Since pt is stable now, I will not restart Plaquenil at this moment due to higher possibility of G6PD in [place] population.  Pending G6PD test.  Patient meets criteria for sepsis with fever, tachycardia and tachypnea.  Lactic acid normal at 1.6, 1.4.  Currently  hemodynamically stable.  - Admit to tele bed as inpt - Atrovent inhaler, PRN albuterol inhaler and PRN Mucinex for cough - Follow-up flu PCR and RVP - f/u Blood culture - Gentle IV fluid: 75 cc/h of normal saline - Daily CRP/Ferritin/D-dimer/CBC/Mag/CMP - start azithromycin empirically - Mucinex for cough  - will check  - BNP,Trop, IL-6, Hep B SAg   COVID subjective risk assessment:  Physician PPE: I used Capr, gown, glove Patient PPE: mask Fever: yes Cough: yes SOB: yes URI symptoms: yes GI symptoms: yes Travel: moved her from [place] 08[place]2018 Sick contacts: no CBC: leukopenia, lymphopenia-->no BMP:  BUN/Cr=9/0.85 LFTs: increased AST/ALT/Tbili-->no CRP, LDH: increased-->yes Procalcitonin: low-->yes CXR: hazy bilateral peripheral opacities-->no CT chest: GGO, consolidation, crazy paving-->not done COVID subjective risk assessment: low risk (on 2L O2) COVID Testing: indicated per current ID/[place] guidelines-->already done with positive test Precaution: Droplet and contact    Inpatient status:  # Patient requires inpatient status due to high intensity of service, high risk for further deterioration and high frequency of surveillance required.  I certify that at the point of admission it is my clinical judgment that the patient will require inpatient hospital care spanning beyond 2 midnights from the point of admission.  . Now patient has presenting with due to respiratory infection disease, with symptoms of fever, chills, cough, shortness of breath, chest pain, hemoptysis, generalized weakness. . The initial radiographic and laboratory data are worrisome because of positive COVID-19, elevated CRP, LDH, low procalcitonin, elevated ferritin, fibrinogen.. . Current medical needs: please see my assessment and plan . Predictability of an adverse outcome (risk): Patient is a healthy young man, but presents with acute respiratory infection disease, needs new oxygen requirement.  Patient feels tired all the time.  Patient is at high risk for deteriorating.  Will need to be treated in hospital for at least 2 days.    DVT ppx: SCD ( pt has hemoptysis, cannot use Lovenox/heparin) Code Status: Full code Family Communication: None at bed side.   Disposition Plan:  Anticipate discharge back to previous home environment Consults called:  EPD discussed with ID, Dr. Lorenso Courier Admission status:  Inpatient/tele    Date of Service 03/13/2019    Lorretta Harp [facility]   If 7PM-7AM, please contact night-coverage [email or web address] Password [facility] 03/13/2019, 9:23 PM

## 2019-03-13 NOTE — ED Notes (Signed)
ED TO INPATIENT HANDOFF REPORT  ED Nurse Name and Phone #: Dawn RN  S Name/Age/Gender Jon Yates Stream 22 y.o. male Room/Bed: WA03/WA03  Code Status   Code Status: Full Code  Home/SNF/Other Home Patient oriented to: self, place, time and situation Is this baseline? Yes   Triage Complete: Triage complete  Chief Complaint Shortness of Breath   Triage Note Pt is poor historian, seen at [facility] on the [facility]th for [facility] and cough, dx URI and started on Doxy, pt states he is not getting better.    Allergies No Known Allergies  Level of Care/Admitting Diagnosis ED Disposition    ED Disposition Condition Comment   Admit  Hospital Area: [facility] [[id number]]  Level of Care: Telemetry [5]  Admit to tele based on following criteria: Other see comments  Comments: sepsis  Covid Evaluation: Confirmed COVID Positive  Isolation Risk Level: Low Risk  (Less than 4L Sabinal supplementation)  Diagnosis: Acute respiratory disease due to COVID-19 virus [[id number]]  Admitting Physician: Lorretta Harp [[id number]]  Attending Physician: Lorretta Harp [[id number]]  Estimated length of stay: past midnight tomorrow  Certification:: I certify this patient will need inpatient services for at least 2 midnights  Bed request comments: vovid19 positive, low risk  PT Class (Do Not Modify): Inpatient [101]  PT Acc Code (Do Not Modify): Private [1]       B Medical/Surgery History History reviewed. No pertinent past medical history. History reviewed. No pertinent surgical history.   A IV Location/Drains/Wounds Patient Lines/Drains/Airways Status   Active Line/Drains/Airways    Name:   Placement date:   Placement time:   Site:   Days:   Peripheral IV 03/13/19 Left Antecubital   03/13/19    1703    Antecubital   less than 1   Peripheral IV 03/13/19 Left Hand   03/13/19    1857    Hand   less than 1          Intake/Output Last 24 hours No intake or output data in the 24 hours ending 03/13/19  2303  Labs/Imaging Results for orders placed or performed during the hospital encounter of 03/13/19 (from the past 48 hour(s))  SARS Coronavirus 2 [facility] order, Performed in [facility] hospital lab)     Status: Abnormal   Collection Time: 03/13/19  4:19 PM  Result Value Ref Range   SARS Coronavirus 2 POSITIVE (A) NEGATIVE    Comment: RESULT CALLED TO, READ BACK BY AND VERIFIED WITH: B.JESSEE,RN [id number]  BY V.WILKINS (NOTE) If result is NEGATIVE SARS-CoV-2 target nucleic acids are NOT DETECTED. The SARS-CoV-2 RNA is generally detectable in upper and lower  respiratory specimens during the acute phase of infection. The lowest  concentration of SARS-CoV-2 viral copies this assay can detect is 250  copies / mL. A negative result does not preclude SARS-CoV-2 infection  and should not be used as the sole basis for treatment or other  patient management decisions.  A negative result may occur with  improper specimen collection / handling, submission of specimen other  than nasopharyngeal swab, presence of viral mutation(s) within the  areas targeted by this assay, and inadequate number of viral copies  (<250 copies / mL). A negative result must be combined with clinical  observations, patient history, and epidemiological information. If result is POSITIVE SARS-CoV-2 target nucleic acids are DETECTED.  The SARS-CoV-2 RNA is generally detectable in upper and lower  respiratory specimens during the acute phase of infection.  Positive  results  are indicative of active infection with SARS-CoV-2.  Clinical  correlation with patient history and other diagnostic information is  necessary to determine patient infection status.  Positive results do  not rule out bacterial infection or co-infection with other viruses. If result is PRESUMPTIVE POSTIVE SARS-CoV-2 nucleic acids MAY BE PRESENT.   A presumptive positive result was obtained on the submitted specimen  and confirmed on repeat  testing.  While 2019 novel coronavirus  (SARS-CoV-2) nucleic acids may be present in the submitted sample  additional confirmatory testing may be necessary for epidemiological  and / or clinical management purposes  to differentiate between  SARS-CoV-2 and other Sarbecovirus currently known to infect humans.  If clinically indicated additional testing with an alternate test  methodology [phone])  is advised. The SARS-CoV-2 RNA is generally  detectable in upper and lower respiratory specimens during the acute  phase of infection. The expected result is Negative. Fact Sheet for Patients:  BoilerBrush.com.cy Fact Sheet for Healthcare Providers: https://pope.com/ This test is not yet approved or cleared by the [place] FDA and has been authorized for detection and/or diagnosis of SARS-CoV-2 by FDA under an Emergency Use Authorization (EUA).  This EUA will remain in effect (meaning this test can be used) for the duration of the COVID-19 declaration under Section 564(b)(1) of the Act, 21 U.S.C. section 360bbb-3(b)(1), unless the authorization is terminated or revoked sooner. Performed at [facility], [address] [place], [place] [address]   Lactic acid, plasma     Status: None   Collection Time: 03/13/19  4:19 PM  Result Value Ref Range   Lactic Acid, Venous 1.6 0.5 - 1.9 mmol/L    Comment: Performed at [facility], [address], [place], [place] [address]  CBC WITH DIFFERENTIAL     Status: None   Collection Time: 03/13/19  4:19 PM  Result Value Ref Range   WBC 4.9 4.0 - 10.5 K/uL    Comment: WHITE COUNT CONFIRMED ON SMEAR   RBC 4.96 4.22 - 5.81 MIL/uL   Hemoglobin 14.3 13.0 - 17.0 g/dL   HCT 47.8 29.5 - 62.1 %   MCV 91.3 80.0 - 100.0 fL   MCH 28.8 26.0 - 34.0 pg   MCHC 31.6 30.0 - 36.0 g/dL   RDW 30.8 65.7 - 84.6 %   Platelets 202 150 - 400 K/uL   nRBC 0.0 0.0 - 0.2 %   Neutrophils  Relative % 60 %   Neutro Abs 2.9 1.7 - 7.7 K/uL   Lymphocytes Relative 28 %   Lymphs Abs 1.4 0.7 - 4.0 K/uL   Monocytes Relative 12 %   Monocytes Absolute 0.6 0.1 - 1.0 K/uL   Eosinophils Relative 0 %   Eosinophils Absolute 0.0 0.0 - 0.5 K/uL   Basophils Relative 0 %   Basophils Absolute 0.0 0.0 - 0.1 K/uL   Immature Granulocytes 0 %   Abs Immature Granulocytes 0.02 0.00 - 0.07 K/uL    Comment: Performed at [facility], [address], [place], [place] [address]  Comprehensive metabolic panel     Status: Abnormal   Collection Time: 03/13/19  4:19 PM  Result Value Ref Range   Sodium 137 135 - 145 mmol/L   Potassium 4.4 3.5 - 5.1 mmol/L   Chloride 102 98 - 111 mmol/L   CO2 26 22 - 32 mmol/L   Glucose, Bld 133 (H) 70 - 99 mg/dL   BUN 9 6 - 20 mg/dL  Creatinine, Ser 0.85 0.61 - 1.24 mg/dL   Calcium 8.8 (L) 8.9 - 10.3 mg/dL   Total Protein 7.9 6.5 - 8.1 g/dL   Albumin 3.7 3.5 - 5.0 g/dL   AST 22 15 - 41 U/L   ALT 20 0 - 44 U/L   Alkaline Phosphatase 57 38 - 126 U/L   Total Bilirubin 0.5 0.3 - 1.2 mg/dL   GFR calc non Af Amer >60 >60 mL/min   GFR calc Af Amer >60 >60 mL/min   Anion gap 9 5 - 15    Comment: Performed at [facility], [address] [place], [place] [address]  D-dimer, quantitative     Status: None   Collection Time: 03/13/19  4:19 PM  Result Value Ref Range   D-Dimer, Quant <0.27 0.00 - 0.50 ug/mL-FEU    Comment: Performed at [facility], [address], [place], [place] [address]  Procalcitonin     Status: None   Collection Time: 03/13/19  4:19 PM  Result Value Ref Range   Procalcitonin <0.10 ng/mL    Comment:        Interpretation: PCT (Procalcitonin) <= 0.5 ng/mL: Systemic infection (sepsis) is not likely. Local bacterial infection is possible. (NOTE)       Sepsis PCT Algorithm           Lower Respiratory Tract                                      Infection PCT Algorithm     ----------------------------     ----------------------------         PCT < 0.25 ng/mL                PCT < 0.10 ng/mL         Strongly encourage             Strongly discourage   discontinuation of antibiotics    initiation of antibiotics    ----------------------------     -----------------------------       PCT 0.25 - 0.50 ng/mL            PCT 0.10 - 0.25 ng/mL               OR       >80% decrease in PCT            Discourage initiation of                                            antibiotics      Encourage discontinuation           of antibiotics    ----------------------------     -----------------------------         PCT >= 0.50 ng/mL              PCT 0.26 - 0.50 ng/mL               AND        <80% decrease in PCT             Encourage initiation of  antibiotics       Encourage continuation           of antibiotics    ----------------------------     -----------------------------        PCT >= 0.50 ng/mL                  PCT > 0.50 ng/mL               AND         increase in PCT                  Strongly encourage                                      initiation of antibiotics    Strongly encourage escalation           of antibiotics                                     -----------------------------                                           PCT <= 0.25 ng/mL                                                 OR                                        > 80% decrease in PCT                                     Discontinue / Do not initiate                                             antibiotics Performed at [facility], [address] [place], [place] [address]   Lactate dehydrogenase     Status: Abnormal   Collection Time: 03/13/19  4:19 PM  Result Value Ref Range   LDH 219 (H) 98 - 192 U/L    Comment: Performed at [facility], [address] [place], [place] [address]  Ferritin     Status:  None   Collection Time: 03/13/19  4:19 PM  Result Value Ref Range   Ferritin 286 24 - 336 ng/mL    Comment: Performed at [facility], [address] [place], [place] [address]  Triglycerides     Status: None   Collection Time: 03/13/19  4:19 PM  Result Value Ref Range   Triglycerides 134 <150 mg/dL    Comment: Performed at [facility], [address], [place], [place] [address]  Fibrinogen     Status: Abnormal   Collection Time: 03/13/19  4:19 PM  Result Value Ref  Range   Fibrinogen 542 (H) 210 - 475 mg/dL    Comment: Performed at [facility], [address] [place], [place] [address]  C-reactive protein     Status: Abnormal   Collection Time: 03/13/19  4:19 PM  Result Value Ref Range   CRP 6.0 (H) <1.0 mg/dL    Comment: Performed at [facility], [address] [place], [place] [address]  Rapid HIV screen (HIV 1/2 Ab+Ag)     Status: None   Collection Time: 03/13/19  4:19 PM  Result Value Ref Range   HIV-1 P24 Antigen - HIV24 NON REACTIVE NON REACTIVE   HIV 1/2 Antibodies NON REACTIVE NON REACTIVE   Interpretation (HIV Ag Ab)      A non reactive test result means that HIV 1 or HIV 2 antibodies and HIV 1 p24 antigen were not detected in the specimen.    Comment: RESULT CALLED TO, READ BACK BY AND VERIFIED WITH: Linnell Fulling RN @1823  ON 03/13/2019 JACKSON,K Performed at [facility], [address], [place], [place] [address]   Lactic acid, plasma     Status: None   Collection Time: 03/13/19  6:19 PM  Result Value Ref Range   Lactic Acid, Venous 1.4 0.5 - 1.9 mmol/L    Comment: Performed at [facility], [address] [place], [place] [address]   Dg Chest Port 1 View  Result Date: 03/13/2019 CLINICAL DATA:  Shortness of breath and cough EXAM: PORTABLE CHEST 1 VIEW COMPARISON:  March 11, 2019 FINDINGS: Stable cardiomegaly. No pneumothorax. No pulmonary nodules or  masses. No focal infiltrates. Mild bibasilar atelectasis. IMPRESSION: No active disease. Electronically Signed   By: Gerome Sam III M.D   On: 03/13/2019 16:23    Pending Labs Unresulted Labs (From admission, onward)    Start     Ordered   03/14/19 0500  D-dimer, quantitative (not at [facility])  Daily,   R     03/13/19 2042   03/14/19 0500  C-reactive protein  Daily,   R     03/13/19 2042   03/14/19 0500  CBC  Tomorrow morning,   R     03/13/19 2046   03/14/19 0500  Ferritin  Daily,   R     03/13/19 2113   03/14/19 0500  Comprehensive metabolic panel  Daily,   R     03/13/19 2113   03/13/19 2101  Hepatitis B surface antibody,quantitative  Once,   R     03/13/19 2100   03/13/19 2100  Brain natriuretic peptide  Once,   R     03/13/19 2100   03/13/19 2100  Troponin I - Once  Once,   R     03/13/19 2100   03/13/19 2100  Interleukin-6, Plasma  Once,   R     03/13/19 2100   03/13/19 2042  Influenza panel by PCR (type A & B)  Add-on,   R     03/13/19 2041   03/13/19 2042  Respiratory Panel by PCR  (Respiratory virus panel with precautions)  Add-on,   R     03/13/19 2041   03/13/19 1733  QuantiFERON-TB Gold Plus  Once,   R     03/13/19 1733   03/13/19 1631  Glucose 6 phosphate dehydrogenase  Once,   R     03/13/19 1630   03/13/19 1619  Blood Culture (routine x 2)  BLOOD CULTURE X 2,   STAT  03/13/19 1620          Vitals/Pain Today's Vitals   03/13/19 2200 03/13/19 2230 03/13/19 2245 03/13/19 2255  BP: 135/86 132/82 (!) 134/98   Pulse: 87 80 88   Resp: 20 (!) 23 (!) 32   Temp:    99.2 F (37.3 C)  TempSrc:    Oral  SpO2: 100% 100% 100%   Weight:      Height:        Isolation Precautions Droplet precaution  Medications Medications  ipratropium (ATROVENT HFA) inhaler 2 puff (2 puffs Inhalation Given 03/13/19 2246)  albuterol (VENTOLIN HFA) 108 (90 Base) MCG/ACT inhaler 2 puff (has no administration in time range)  dextromethorphan-guaiFENesin (MUCINEX DM) 30-600 MG  per 12 hr tablet 1 tablet (has no administration in time range)  0.9 %  sodium chloride infusion (has no administration in time range)  acetaminophen (TYLENOL) tablet 650 mg (has no administration in time range)    Or  acetaminophen (TYLENOL) suppository 650 mg (has no administration in time range)  ondansetron (ZOFRAN) tablet 4 mg (has no administration in time range)    Or  ondansetron (ZOFRAN) injection 4 mg (has no administration in time range)  azithromycin (ZITHROMAX) tablet 500 mg (500 mg Oral Given 03/13/19 2246)    Followed by  azithromycin (ZITHROMAX) tablet 250 mg (has no administration in time range)    Mobility walks     Focused Assessments    R Recommendations: See Admitting Provider Note  Report given to:   Additional Notes:

## 2019-03-14 ENCOUNTER — Other Ambulatory Visit: Payer: Self-pay

## 2019-03-14 LAB — RESPIRATORY PANEL BY PCR

## 2019-03-14 LAB — FERRITIN: Ferritin: 304 ng/mL (ref 24–336)

## 2019-03-14 LAB — INFLUENZA PANEL BY PCR (TYPE A & B)
Influenza A By PCR: NEGATIVE
Influenza B By PCR: NEGATIVE

## 2019-03-14 LAB — COMPREHENSIVE METABOLIC PANEL
ALT: 19 U/L (ref 0–44)
AST: 21 U/L (ref 15–41)
Albumin: 3.4 g/dL — ABNORMAL LOW (ref 3.5–5.0)
Alkaline Phosphatase: 51 U/L (ref 38–126)
Anion gap: 8 (ref 5–15)
BUN: 10 mg/dL (ref 6–20)
CO2: 24 mmol/L (ref 22–32)
Calcium: 8.6 mg/dL — ABNORMAL LOW (ref 8.9–10.3)
Chloride: 105 mmol/L (ref 98–111)
Creatinine, Ser: 0.8 mg/dL (ref 0.61–1.24)
GFR calc Af Amer: >60 mL/min (ref >60)
GFR calc non Af Amer: >60 mL/min (ref >60)
Glucose, Bld: 133 mg/dL — ABNORMAL HIGH (ref 70–99)
Potassium: 3.9 mmol/L (ref 3.5–5.1)
Sodium: 137 mmol/L (ref 135–145)
Total Bilirubin: 0.3 mg/dL (ref 0.3–1.2)
Total Protein: 7.4 g/dL (ref 6.5–8.1)

## 2019-03-14 LAB — CBC
HCT: 42.1 % (ref 39.0–52.0)
Hemoglobin: 13.5 g/dL (ref 13.0–17.0)
MCH: 29.2 pg (ref 26.0–34.0)
MCHC: 32.1 g/dL (ref 30.0–36.0)
MCV: 90.9 fL (ref 80.0–100.0)
Platelets: 205 10*3/uL (ref 150–400)
RBC: 4.63 MIL/uL (ref 4.22–5.81)
RDW: 13.2 % (ref 11.5–15.5)
WBC: 8.1 10*3/uL (ref 4.0–10.5)
nRBC: 0 % (ref 0.0–0.2)

## 2019-03-14 LAB — TROPONIN I: Troponin I: <0.03 ng/mL (ref <0.03)

## 2019-03-14 LAB — D-DIMER, QUANTITATIVE: D-Dimer, Quant: 0.38 ug/mL-FEU (ref 0.00–0.50)

## 2019-03-14 LAB — C-REACTIVE PROTEIN: CRP: 6.2 mg/dL — ABNORMAL HIGH (ref <1.0)

## 2019-03-14 LAB — BRAIN NATRIURETIC PEPTIDE: B Natriuretic Peptide: 20.9 pg/mL (ref 0.0–100.0)

## 2019-03-14 MED ORDER — OXYCODONE HCL 5 MG PO TABS
5.0000 mg | ORAL_TABLET | Freq: Four times a day (QID) | ORAL | Status: DC | PRN
  Administered 2019-03-14 – 2019-03-18 (×7): 5 mg via ORAL
  Filled 2019-03-14 (×7): qty 1

## 2019-03-14 NOTE — Progress Notes (Signed)
PROGRESS NOTE    Jon HallsJean Paul Yates  ZOX:[id number] DOB: 1996/05/05 DOA: 03/13/2019 PCP: Patient, No Pcp Per    Brief Narrative:  23 year old male with no significant medical history, recently immigrated to [place] from [place] on 08/2018 presented to the hospital with fever, chills, cough and shortness of breath chest pain and generalized weakness for about a week.  He had visited emergency room 3 days ago and was treated with doxycycline .  He was suspected COVID and was advised to stay home and self treatment as he was fairly stable.  He continued to have pleuritic chest pain and aggravation with cough and deep breathing so came to ER last night.  COVID-19 was positive, temperature 100, oxygen saturations 93-97% on 2 L.  Chest x-ray was normal. Because of significant symptoms he was admitted to the hospital to treat for COVID-19 infection.   Assessment & Plan:   Principal Problem:   Acute respiratory disease due to COVID-19 virus Active Problems:   Sepsis (HCC)  Acute respiratory disease due to COVID-19 virus with mild hypoxia: About 7 days of symptoms. Patient had bloody streak on his initial sputum, no more sputum production.  Denies any chronic cough or fever.  Probably due to URI and bronchitis. Cough medications, bronchodilator inhalers Azithromycin for 5 days, QTC 400. Daily inflammatory markers, CRP/ferritin/d-dimer/CBC/magnesium/CMP Pending HIV, blood cultures, G6PD deficiency in case we need to use hydroxychloroquine.  Sepsis: Present on admission with no endorgan damage: Due to #1.  Improving.  Patient currently on 1 to 2 L of oxygen, droplet and contact precautions. Since patient is needing monitoring in the hospital tonight, [facility] in order to cohort COVID-19 patient needing hospitalization.  DVT prophylaxis: SCDs Code Status: Full code Family Communication: Patient is competent Disposition Plan: [facility] campus.  Anticipate  discharge home tomorrow if remains a stable   Consultants:   None.  ID Case discussion on admission  Procedures:   None  Antimicrobials:   Azithromycin, 03/13/2019-   Subjective: Patient was seen and examined at the bedside.  His only complaint was not having breakfast and feeling very hungry.  He was also having some dry cough.  T-max 99.5.  Remains on 1 to 2 L of oxygen.  Denies any nausea vomiting or diarrhea.  Some chest discomfort on deep breathing and coughing. Patient recently migrated from [place], he speaks limited [place] however he is able to communicate.  Objective: Vitals:   03/14/19 0515 03/14/19 0547 03/14/19 0645 03/14/19 0818  BP: 131/89 129/85 126/88 119/88  Pulse: 96 96 92 90  Resp: (!) 27 (!) 30 (!) 31 (!) 28  Temp: 98.8 F (37.1 C) 98.8 F (37.1 C) 98.6 F (37 C) 99.5 F (37.5 C)  TempSrc: Oral Oral Oral Oral  SpO2: 96% 94% 92% 94%  Weight:      Height:        Intake/Output Summary (Last 24 hours) at 03/14/2019 0950 Last data filed at 03/14/2019 0900 Gross per 24 hour  Intake 768.7 ml  Output 400 ml  Net 368.7 ml   Filed Weights   03/13/19 1514 03/14/19 0430  Weight: 99.7 kg 103.5 kg    Examination:  General exam: Appears calm and comfortable, slightly anxious. Respiratory system: Clear to auscultation. Respiratory effort normal.  No added sounds. Cardiovascular system: S1 & S2 heard, RRR. No JVD, murmurs, rubs, gallops or clicks. No pedal edema.  Tachycardia. Gastrointestinal system: Abdomen is nondistended, soft and nontender. No organomegaly or  masses felt. Normal bowel sounds heard. Central nervous system: Alert and oriented. No focal neurological deficits. Extremities: Symmetric 5 x 5 power. Skin: No rashes, lesions or ulcers Psychiatry: Judgement and insight appear normal. Mood & affect anxious.    Data Reviewed: I have personally reviewed following labs and imaging studies  CBC: Recent Labs  Lab 03/11/19 1339 03/13/19 1619  03/14/19 0438  WBC 4.4 4.9 8.1  NEUTROABS 2.6 2.9  --   HGB 15.0 14.3 13.5  HCT 46.2 45.3 42.1  MCV 89.0 91.3 90.9  PLT 161 202 205   Basic Metabolic Panel: Recent Labs  Lab 03/11/19 1339 03/13/19 1619 03/14/19 0438  NA 137 137 137  K 4.5 4.4 3.9  CL 103 102 105  CO2 22 26 24   GLUCOSE 110* 133* 133*  BUN 9 9 10   CREATININE 0.74 0.85 0.80  CALCIUM 9.2 8.8* 8.6*   GFR: Estimated Creatinine Clearance: 160.4 mL/min (by C-G formula based on SCr of 0.8 mg/dL). Liver Function Tests: Recent Labs  Lab 03/13/19 1619 03/14/19 0438  AST 22 21  ALT 20 19  ALKPHOS 57 51  BILITOT 0.5 0.3  PROT 7.9 7.4  ALBUMIN 3.7 3.4*   No results for input(s): LIPASE, AMYLASE in the last 168 hours. No results for input(s): AMMONIA in the last 168 hours. Coagulation Profile: No results for input(s): INR, PROTIME in the last 168 hours. Cardiac Enzymes: Recent Labs  Lab 03/13/19 2100  TROPONINI <0.03   BNP (last 3 results) No results for input(s): PROBNP in the last 8760 hours. HbA1C: No results for input(s): HGBA1C in the last 72 hours. CBG: No results for input(s): GLUCAP in the last 168 hours. Lipid Profile: Recent Labs    03/13/19 1619  TRIG 134   Thyroid Function Tests: No results for input(s): TSH, T4TOTAL, FREET4, T3FREE, THYROIDAB in the last 72 hours. Anemia Panel: Recent Labs    03/13/19 1619 03/14/19 0438  FERRITIN 286 304   Sepsis Labs: Recent Labs  Lab 03/13/19 1619 03/13/19 1819  PROCALCITON <0.10  --   LATICACIDVEN 1.6 1.4    Recent Results (from the past 240 hour(s))  SARS Coronavirus 2 [facility] order, Performed in [facility] hospital lab)     Status: Abnormal   Collection Time: 03/13/19  4:19 PM  Result Value Ref Range Status   SARS Coronavirus 2 POSITIVE (A) NEGATIVE Final    Comment: RESULT CALLED TO, READ BACK BY AND VERIFIED WITH: B.JESSEE,RN [id number] @1912  BY V.WILKINS (NOTE) If result is NEGATIVE SARS-CoV-2 target nucleic acids are NOT  DETECTED. The SARS-CoV-2 RNA is generally detectable in upper and lower  respiratory specimens during the acute phase of infection. The lowest  concentration of SARS-CoV-2 viral copies this assay can detect is 250  copies / mL. A negative result does not preclude SARS-CoV-2 infection  and should not be used as the sole basis for treatment or other  patient management decisions.  A negative result may occur with  improper specimen collection / handling, submission of specimen other  than nasopharyngeal swab, presence of viral mutation(s) within the  areas targeted by this assay, and inadequate number of viral copies  (<250 copies / mL). A negative result must be combined with clinical  observations, patient history, and epidemiological information. If result is POSITIVE SARS-CoV-2 target nucleic acids are DETECTED.  The SARS-CoV-2 RNA is generally detectable in upper and lower  respiratory specimens during the acute phase of infection.  Positive  results are indicative of active infection  with SARS-CoV-2.  Clinical  correlation with patient history and other diagnostic information is  necessary to determine patient infection status.  Positive results do  not rule out bacterial infection or co-infection with other viruses. If result is PRESUMPTIVE POSTIVE SARS-CoV-2 nucleic acids MAY BE PRESENT.   A presumptive positive result was obtained on the submitted specimen  and confirmed on repeat testing.  While 2019 novel coronavirus  (SARS-CoV-2) nucleic acids may be present in the submitted sample  additional confirmatory testing may be necessary for epidemiological  and / or clinical management purposes  to differentiate between  SARS-CoV-2 and other Sarbecovirus currently known to infect humans.  If clinically indicated additional testing with an alternate test  methodology [phone])  is advised. The SARS-CoV-2 RNA is generally  detectable in upper and lower respiratory specimens during  the acute  phase of infection. The expected result is Negative. Fact Sheet for Patients:  BoilerBrush.com.cy Fact Sheet for Healthcare Providers: https://pope.com/ This test is not yet approved or cleared by the [place] FDA and has been authorized for detection and/or diagnosis of SARS-CoV-2 by FDA under an Emergency Use Authorization (EUA).  This EUA will remain in effect (meaning this test can be used) for the duration of the COVID-19 declaration under Section 564(b)(1) of the Act, 21 U.S.C. section 360bbb-3(b)(1), unless the authorization is terminated or revoked sooner. Performed at [facility], [address], [place], [place] [address]   Blood Culture (routine x 2)     Status: None (Preliminary result)   Collection Time: 03/13/19  4:24 PM  Result Value Ref Range Status   Specimen Description   Final    BLOOD LEFT HAND Performed at [facility], [address] [place], [place] [address]    Special Requests   Final    BOTTLES DRAWN AEROBIC AND ANAEROBIC Blood Culture adequate volume Performed at [facility], [address], [place], [place] [address]    Culture PENDING  Incomplete   Report Status PENDING  Incomplete  Respiratory Panel by PCR     Status: None   Collection Time: 03/13/19  8:42 PM  Result Value Ref Range Status   Adenovirus NOT DETECTED NOT DETECTED Final   Coronavirus 229E NOT DETECTED NOT DETECTED Final    Comment: (NOTE) The Coronavirus on the Respiratory Panel, DOES NOT test for the novel  Coronavirus (2019 nCoV)    Coronavirus HKU1 NOT DETECTED NOT DETECTED Final   Coronavirus NL63 NOT DETECTED NOT DETECTED Final   Coronavirus OC43 NOT DETECTED NOT DETECTED Final   Metapneumovirus NOT DETECTED NOT DETECTED Final   Rhinovirus / Enterovirus NOT DETECTED NOT DETECTED Final   Influenza A NOT DETECTED NOT DETECTED Final   Influenza B NOT DETECTED NOT  DETECTED Final   Parainfluenza Virus 1 NOT DETECTED NOT DETECTED Final   Parainfluenza Virus 2 NOT DETECTED NOT DETECTED Final   Parainfluenza Virus 3 NOT DETECTED NOT DETECTED Final   Parainfluenza Virus 4 NOT DETECTED NOT DETECTED Final   Respiratory Syncytial Virus NOT DETECTED NOT DETECTED Final   Bordetella pertussis NOT DETECTED NOT DETECTED Final   Chlamydophila pneumoniae NOT DETECTED NOT DETECTED Final   Mycoplasma pneumoniae NOT DETECTED NOT DETECTED Final    Comment: Performed at [facility], [address], [place], [place] [address]         Radiology Studies: Dg Chest [facility] 1 View  Result Date: 03/13/2019 CLINICAL DATA:  Shortness of breath and cough EXAM: PORTABLE  CHEST 1 VIEW COMPARISON:  March 11, 2019 FINDINGS: Stable cardiomegaly. No pneumothorax. No pulmonary nodules or masses. No focal infiltrates. Mild bibasilar atelectasis. IMPRESSION: No active disease. Electronically Signed   By: Gerome Sam III M.D   On: 03/13/2019 16:23        Scheduled Meds: . azithromycin  250 mg Oral Daily  . ipratropium  2 puff Inhalation Q4H   Continuous Infusions: . sodium chloride 75 mL/hr at 03/14/19 0600     LOS: 1 day    Time spent: 25 minutes    Dorcas Carrow, MD [facility] Pager [phone]  If 7PM-7AM, please contact night-coverage [email or web address] Password [facility] 03/14/2019, 9:50 AM

## 2019-03-15 ENCOUNTER — Inpatient Hospital Stay (HOSPITAL_COMMUNITY): Payer: HRSA Program

## 2019-03-15 LAB — COMPREHENSIVE METABOLIC PANEL
ALT: 23 U/L (ref 0–44)
AST: 25 U/L (ref 15–41)
Albumin: 3.2 g/dL — ABNORMAL LOW (ref 3.5–5.0)
Alkaline Phosphatase: 51 U/L (ref 38–126)
Anion gap: 7 (ref 5–15)
BUN: 8 mg/dL (ref 6–20)
CO2: 25 mmol/L (ref 22–32)
Calcium: 8.4 mg/dL — ABNORMAL LOW (ref 8.9–10.3)
Chloride: 104 mmol/L (ref 98–111)
Creatinine, Ser: 0.69 mg/dL (ref 0.61–1.24)
GFR calc Af Amer: >60 mL/min (ref >60)
GFR calc non Af Amer: >60 mL/min (ref >60)
Glucose, Bld: 121 mg/dL — ABNORMAL HIGH (ref 70–99)
Potassium: 3.8 mmol/L (ref 3.5–5.1)
Sodium: 136 mmol/L (ref 135–145)
Total Bilirubin: 0.6 mg/dL (ref 0.3–1.2)
Total Protein: 7.2 g/dL (ref 6.5–8.1)

## 2019-03-15 LAB — INTERLEUKIN-6, PLASMA: Interleukin-6, Plasma: 16.6 pg/mL — ABNORMAL HIGH (ref 0.0–12.2)

## 2019-03-15 LAB — FERRITIN
Ferritin: 355 ng/mL — ABNORMAL HIGH (ref 24–336)
Ferritin: 351 ng/mL — ABNORMAL HIGH (ref 24–336)

## 2019-03-15 LAB — C-REACTIVE PROTEIN
CRP: 9.7 mg/dL — ABNORMAL HIGH (ref <1.0)
CRP: 8.6 mg/dL — ABNORMAL HIGH (ref <1.0)

## 2019-03-15 LAB — HEPATITIS B SURFACE ANTIBODY, QUANTITATIVE: Hepatitis B-Post: 25.4 m[IU]/mL (ref >9.9)

## 2019-03-15 LAB — GLUCOSE 6 PHOSPHATE DEHYDROGENASE: G6PDH: UNDETERMINED U/g{Hb}

## 2019-03-15 LAB — HEMATOLOGY COMMENTS:

## 2019-03-15 LAB — D-DIMER, QUANTITATIVE
D-Dimer, Quant: 0.53 ug/mL-FEU — ABNORMAL HIGH (ref 0.00–0.50)
D-Dimer, Quant: 0.62 ug/mL-FEU — ABNORMAL HIGH (ref 0.00–0.50)

## 2019-03-15 MED ORDER — HYDRALAZINE HCL 20 MG/ML IJ SOLN
5.0000 mg | Freq: Once | INTRAMUSCULAR | Status: AC
  Administered 2019-03-15: 5 mg via INTRAVENOUS
  Filled 2019-03-15: qty 1

## 2019-03-15 MED ORDER — HYDROCOD POLST-CPM POLST ER 10-8 MG/5ML PO SUER: 5.0000 mL | Freq: Two times a day (BID) | ORAL | Status: DC | PRN

## 2019-03-15 MED ORDER — AZITHROMYCIN 250 MG PO TABS
250.0000 mg | ORAL_TABLET | Freq: Every day | ORAL | Status: DC
  Administered 2019-03-15: 22:00:00 250 mg via ORAL
  Filled 2019-03-15: qty 1

## 2019-03-15 NOTE — Progress Notes (Signed)
Report given to [place] RN at [facility].

## 2019-03-15 NOTE — Progress Notes (Signed)
Vitals: 154/103;HR89. Patient also c/o pain in mid-sternum area. PCP notified. Awaiting any new orders.

## 2019-03-15 NOTE — Progress Notes (Signed)
Patient's B/P 141/112;HR 87. PCP was notified.

## 2019-03-15 NOTE — Progress Notes (Addendum)
Pt blood pressure 146/106. MD notified, no new orders placed.

## 2019-03-15 NOTE — Progress Notes (Signed)
Patient admitted to [facility] room number 171. Patient with help or translator oriented to unit and call bell as well as fall prevention. Patient placed on bedside monitor. Respirations  Noted to be in the 40's. Dr. Radonna Ricker aware. Per MD if Respirations do not slow down call him back.

## 2019-03-15 NOTE — Progress Notes (Signed)
Attempted to call point of care contact person to inform of pending transfer to [facility] site. Message left requesting a call back but have not heard back yet.

## 2019-03-15 NOTE — Progress Notes (Signed)
PROGRESS NOTE    Jon Yates  ZOX:[id number] DOB: 8/Gerrit Halls28/1997 DOA: 03/13/2019 PCP: Patient, No Pcp Per    Brief Narrative:  23 year old male with no significant medical history, recently immig[place] to [place] from [place] on 08/2018 presented to the hospital with fever, chills, cough and shortness of breath chest pain and generalized weakness for about a week.  He had visited emergency room 3 days ago and was treated with doxycycline .  He was suspected COVID and was advised to stay home and self treatment as he was fairly stable.  He continued to have pleuritic chest pain and aggravation with cough and deep breathing so came to ER last night.  COVID-19 was positive, temperature 100, oxygen saturations 93-97% on 2 L.  Chest x-ray was normal. Because of significant symptoms he was admitted to the hospital to treat for COVID-19 infection.   Assessment & Plan:   Principal Problem:   Acute respiratory disease due to COVID-19 virus Active Problems:   Sepsis (HCC)  Acute respiratory disease due to COVID-19 virus with mild hypoxia: About 8 days of symptoms. Patient had bloody streak on his initial sputum, no more sputum production. Probably due to URI and bronchitis. Cough medications, bronchodilator inhalers Azithromycin for 5 days, QTC 400. Daily inflammatory markers, CRP/ferritin/d-dimer/CBC/magnesium/CMP HIV negative, blood cultures, G6PD deficiency in case we need to use hydroxychloroquine. Patient is on 1 to 2 L of oxygen and clinically looks fairly stable. Repeat chest x-ray shows left lower lobe infiltrates, on azithromycin. If patient has increasing oxygen requirement, will be candidate for Tocilizumab.  Currently no indication.  Sepsis: Present on admission with no endorgan damage: Due to #1.  Improving.  Patient currently on 1 to 2 L of oxygen, droplet and contact precautions. Since patient is needing monitoring in the hospital tonight, [facility] in order to cohort COVID-19 patient needing hospitalization.  DVT prophylaxis: SCDs Code Status: Full code Family Communication: Patient is competent Disposition Plan: [facility] campus.  Anticipate discharge home tomorrow if remains stable and comes off oxygen.    Consultants:   None.  ID Case discussion on admission  Procedures:   None  Antimicrobials:   Azithromycin, 03/13/2019-   Subjective: Patient was seen and examined at the bedside.  Afebrile overnight.  Complains of midsternal chest pain. Overnight events noted.  Patient complained of chest pain with coughing. EKG was done which is normal. Chest x-ray was done shows new left lower lobe infiltrate. A chest pain is atypical and aggravated by coughing, will treat with Pain medications and cough medications.  Objective: Vitals:   03/15/19 0047 03/15/19 0250 03/15/19 0625 03/15/19 0834  BP: (!) 141/112 (!) 149/110 (!) 154/103 135/90  Pulse: 87  89   Resp: 20  20   Temp: 98.5 F (36.9 C)  98.9 F (37.2 C)   TempSrc: Oral  Oral   SpO2: 96%  92%   Weight:      Height:        Intake/Output Summary (Last 24 hours) at 03/15/2019 0943 Last data filed at 03/15/2019 0824 Gross per 24 hour  Intake 3607.9 ml  Output 2025 ml  Net 1582.9 ml   Filed Weights   03/13/19 1514 03/14/19 0430  Weight: 99.7 kg 103.5 kg    Examination:  General exam: Appears calm and comfortable, slightly anxious. Respiratory system: Clear to auscultation. Respiratory effort normal.  No added sounds. Cardiovascular system: S1 & S2 heard, RRR. No JVD, murmurs, rubs, gallops  or clicks. No pedal edema.  Tachycardia. Gastrointestinal system: Abdomen is nondistended, soft and nontender. No organomegaly or masses felt. Normal bowel sounds heard. Central nervous system: Alert and oriented. No focal neurological deficits. Extremities: Symmetric 5 x 5 power. Skin: No rashes, lesions or ulcers Psychiatry: Judgement and insight appear  normal. Mood & affect anxious.    Data Reviewed: I have personally reviewed following labs and imaging studies  CBC: Recent Labs  Lab 03/11/19 1339 03/13/19 1619 03/14/19 0438  WBC 4.4 4.9 8.1  NEUTROABS 2.6 2.9  --   HGB 15.0 14.3 13.5  HCT 46.2 45.3 42.1  MCV 89.0 91.3 90.9  PLT 161 202 205   Basic Metabolic Panel: Recent Labs  Lab 03/11/19 1339 03/13/19 1619 03/14/19 0438 03/15/19 0517  NA 137 137 137 136  K 4.5 4.4 3.9 3.8  CL 103 102 105 104  CO2 GLUCOSE 110* 133* 133* 121*  BUN CREATININE 0.74 0.85 0.80 0.69  CALCIUM 9.2 8.8* 8.6* 8.4*   GFR: Estimated Creatinine Clearance: 160.4 mL/min (by C-G formula based on SCr of 0.69 mg/dL). Liver Function Tests: Recent Labs  Lab 03/13/19 1619 03/14/19 0438 03/15/19 0517  AST ALT ALKPHOS 57 51 51  BILITOT 0.5 0.3 0.6  PROT 7.9 7.4 7.2  ALBUMIN 3.7 3.4* 3.2*   No results for input(s): LIPASE, AMYLASE in the last 168 hours. No results for input(s): AMMONIA in the last 168 hours. Coagulation Profile: No results for input(s): INR, PROTIME in the last 168 hours. Cardiac Enzymes: Recent Labs  Lab 03/13/19 2100  TROPONINI <0.03   BNP (last 3 results) No results for input(s): PROBNP in the last 8760 hours. HbA1C: No results for input(s): HGBA1C in the last 72 hours. CBG: No results for input(s): GLUCAP in the last 168 hours. Lipid Profile: Recent Labs    03/13/19 1619  TRIG 134   Thyroid Function Tests: No results for input(s): TSH, T4TOTAL, FREET4, T3FREE, THYROIDAB in the last 72 hours. Anemia Panel: Recent Labs    03/14/19 0438 03/15/19 0517  FERRITIN 304 355*   Sepsis Labs: Recent Labs  Lab 03/13/19 1619 03/13/19 1819  PROCALCITON <0.10  --   LATICACIDVEN 1.6 1.4    Recent Results (from the past 240 hour(s))  SARS Coronavirus 2 [facility] order, Performed in [facility] hospital lab)     Status: Abnormal   Collection Time: 03/13/19  4:19 PM   Result Value Ref Range Status   SARS Coronavirus 2 POSITIVE (A) NEGATIVE Final    Comment: RESULT CALLED TO, READ BACK BY AND VERIFIED WITH: B.JESSEE,RN [id number]  BY V.WILKINS (NOTE) If result is NEGATIVE SARS-CoV-2 target nucleic acids are NOT DETECTED. The SARS-CoV-2 RNA is generally detectable in upper and lower  respiratory specimens during the acute phase of infection. The lowest  concentration of SARS-CoV-2 viral copies this assay can detect is 250  copies / mL. A negative result does not preclude SARS-CoV-2 infection  and should not be used as the sole basis for treatment or other  patient management decisions.  A negative result may occur with  improper specimen collection / handling, submission of specimen other  than nasopharyngeal swab, presence of viral mutation(s) within the  areas targeted by this assay, and inadequate number of viral copies  (<250 copies / mL). A negative result must be combined with clinical  observations, patient history, and epidemiological information. If result is  POSITIVE SARS-CoV-2 target nucleic acids are DETECTED.  The SARS-CoV-2 RNA is generally detectable in upper and lower  respiratory specimens during the acute phase of infection.  Positive  results are indicative of active infection with SARS-CoV-2.  Clinical  correlation with patient history and other diagnostic information is  necessary to determine patient infection status.  Positive results do  not rule out bacterial infection or co-infection with other viruses. If result is PRESUMPTIVE POSTIVE SARS-CoV-2 nucleic acids MAY BE PRESENT.   A presumptive positive result was obtained on the submitted specimen  and confirmed on repeat testing.  While 2019 novel coronavirus  (SARS-CoV-2) nucleic acids may be present in the submitted sample  additional confirmatory testing may be necessary for epidemiological  and / or clinical management purposes  to differentiate between  SARS-CoV-2  and other Sarbecovirus currently known to infect humans.  If clinically indicated additional testing with an alternate test  methodology [phone])  is advised. The SARS-CoV-2 RNA is generally  detectable in upper and lower respiratory specimens during the acute  phase of infection. The expected result is Negative. Fact Sheet for Patients:  BoilerBrush.com.cy Fact Sheet for Healthcare Providers: https://pope.com/ This test is not yet approved or cleared by the [place] FDA and has been authorized for detection and/or diagnosis of SARS-CoV-2 by FDA under an Emergency Use Authorization (EUA).  This EUA will remain in effect (meaning this test can be used) for the duration of the COVID-19 declaration under Section 564(b)(1) of the Act, 21 U.S.C. section 360bbb-3(b)(1), unless the authorization is terminated or revoked sooner. Performed at [facility], [address] [place], [place] [address]   Blood Culture (routine x 2)     Status: None (Preliminary result)   Collection Time: 03/13/19  4:19 PM  Result Value Ref Range Status   Specimen Description   Final    BLOOD LEFT ANTECUBITAL Performed at [facility], [address], [place], [place] [address]    Special Requests   Final    BOTTLES DRAWN AEROBIC AND ANAEROBIC Blood Culture adequate volume Performed at [facility], [address] [place], [place] [address]    Culture   Final    NO GROWTH < 24 HOURS Performed at [facility], [address], [place], [place] [address]    Report Status PENDING  Incomplete  Blood Culture (routine x 2)     Status: None (Preliminary result)   Collection Time: 03/13/19  4:24 PM  Result Value Ref Range Status   Specimen Description   Final    BLOOD LEFT HAND Performed at [facility], [address] [place], [place] [address]    Special Requests   Final    BOTTLES DRAWN  AEROBIC AND ANAEROBIC Blood Culture adequate volume Performed at [facility], [address], [place], [place] [address]    Culture PENDING  Incomplete   Report Status PENDING  Incomplete  Respiratory Panel by PCR     Status: None   Collection Time: 03/13/19  8:42 PM  Result Value Ref Range Status   Adenovirus NOT DETECTED NOT DETECTED Final   Coronavirus 229E NOT DETECTED NOT DETECTED Final    Comment: (NOTE) The Coronavirus on the Respiratory Panel, DOES NOT test for the novel  Coronavirus (2019 nCoV)    Coronavirus HKU1 NOT DETECTED NOT DETECTED Final   Coronavirus NL63 NOT DETECTED NOT DETECTED Final   Coronavirus OC43 NOT DETECTED NOT DETECTED Final  Metapneumovirus NOT DETECTED NOT DETECTED Final   Rhinovirus / Enterovirus NOT DETECTED NOT DETECTED Final   Influenza A NOT DETECTED NOT DETECTED Final   Influenza B NOT DETECTED NOT DETECTED Final   Parainfluenza Virus 1 NOT DETECTED NOT DETECTED Final   Parainfluenza Virus 2 NOT DETECTED NOT DETECTED Final   Parainfluenza Virus 3 NOT DETECTED NOT DETECTED Final   Parainfluenza Virus 4 NOT DETECTED NOT DETECTED Final   Respiratory Syncytial Virus NOT DETECTED NOT DETECTED Final   Bordetella pertussis NOT DETECTED NOT DETECTED Final   Chlamydophila pneumoniae NOT DETECTED NOT DETECTED Final   Mycoplasma pneumoniae NOT DETECTED NOT DETECTED Final    Comment: Performed at [facility], [address], [place], [place] [address]         Radiology Studies: Dg Chest Port 1 View  Result Date: 03/15/2019 CLINICAL DATA:  Chest pain.  Positive COVID-19 test EXAM: PORTABLE CHEST 1 VIEW COMPARISON:  March 13, 2019 FINDINGS: There is ill-defined patchy opacity in the lung bases. The lungs elsewhere are clear. Heart is mildly enlarged with pulmonary vascularity within normal limits. No adenopathy. No bone lesions. IMPRESSION: Patchy infiltrate in the bases, slightly more on the left than on the right. Lungs  elsewhere clear. Mild cardiac enlargement. Electronically Signed   By: Bretta Bang III M.D.   On: 03/15/2019 07:50   Dg Chest Port 1 View  Result Date: 03/13/2019 CLINICAL DATA:  Shortness of breath and cough EXAM: PORTABLE CHEST 1 VIEW COMPARISON:  March 11, 2019 FINDINGS: Stable cardiomegaly. No pneumothorax. No pulmonary nodules or masses. No focal infiltrates. Mild bibasilar atelectasis. IMPRESSION: No active disease. Electronically Signed   By: Gerome Sam III M.D   On: 03/13/2019 16:23        Scheduled Meds: . azithromycin  250 mg Oral Daily  . ipratropium  2 puff Inhalation Q4H   Continuous Infusions: . sodium chloride 75 mL/hr at 03/15/19 0159     LOS: 2 days    Time spent: 25 minutes    Dorcas Carrow, MD [facility] Pager [phone]  If 7PM-7AM, please contact night-coverage [email or web address] Password [facility] 03/15/2019, 9:43 AM

## 2019-03-15 NOTE — TOC Initial Note (Signed)
Transition of Care [facility]) - Initial/Assessment Note    Patient Details  Name: Jon Yates OMAYOKHTX MRN: [id number] Date of Birth: 1996/05/04  Transition of Care [facility]) CM/SW Contact:    Geni Bers, RN Phone Number: 03/15/2019, 11:48 AM  Clinical Narrative:                   Expected Discharge Plan: Home/Self Care     Patient Goals and CMS Choice        Expected Discharge Plan and Services Expected Discharge Plan: Home/Self Care       Living arrangements for the past 2 months: Single Family Home                          Prior Living Arrangements/Services Living arrangements for the past 2 months: Single Family Home Lives with:: Relatives Patient language and need for interpreter reviewed:: Yes([place]) Do you feel safe going back to the place where you live?: Yes               Activities of Daily Living Home Assistive Devices/Equipment: None ADL Screening (condition at time of admission) Patient's cognitive ability adequate to safely complete daily activities?: Yes Is the patient deaf or have difficulty hearing?: No Does the patient have difficulty seeing, even when wearing glasses/contacts?: No Does the patient have difficulty concentrating, remembering, or making decisions?: No Patient able to express need for assistance with ADLs?: Yes Does the patient have difficulty dressing or bathing?: No Independently performs ADLs?: Yes (appropriate for developmental age) Does the patient have difficulty walking or climbing stairs?: No Weakness of Legs: Both Weakness of Arms/Hands: Both  Permission Sought/Granted                  Emotional Assessment     Affect (typically observed): Accepting Orientation: : Oriented to Self, Oriented to Place, Oriented to  Time, Oriented to Situation      Admission diagnosis:  Tachypnea [R06.82] 2019 novel coronavirus disease (COVID-19) [U07.1] Patient Active Problem List   Diagnosis Date Noted  . Sepsis  (HCC) 03/13/2019  . Acute respiratory disease due to COVID-19 virus 03/13/2019   PCP:  Patient, No Pcp Per Pharmacy:   [facility] [id number], [place] - [address] [place] [address] Phone: [phone] Fax: [phone]     Social Determinants of Health (SDOH) Interventions    Readmission Risk Interventions No flowsheet data found.

## 2019-03-16 DIAGNOSIS — J9621 Acute and chronic respiratory failure with hypoxia: Secondary | ICD-10-CM

## 2019-03-16 DIAGNOSIS — R6510 Systemic inflammatory response syndrome (SIRS) of non-infectious origin without acute organ dysfunction: Secondary | ICD-10-CM

## 2019-03-16 LAB — COMPREHENSIVE METABOLIC PANEL
ALT: 25 U/L (ref 0–44)
AST: 23 U/L (ref 15–41)
Albumin: 3.3 g/dL — ABNORMAL LOW (ref 3.5–5.0)
Alkaline Phosphatase: 53 U/L (ref 38–126)
Anion gap: 9 (ref 5–15)
BUN: 10 mg/dL (ref 6–20)
CO2: 25 mmol/L (ref 22–32)
Calcium: 8.8 mg/dL — ABNORMAL LOW (ref 8.9–10.3)
Chloride: 104 mmol/L (ref 98–111)
Creatinine, Ser: 0.68 mg/dL (ref 0.61–1.24)
GFR calc Af Amer: >60 mL/min (ref >60)
GFR calc non Af Amer: >60 mL/min (ref >60)
Glucose, Bld: 104 mg/dL — ABNORMAL HIGH (ref 70–99)
Potassium: 4 mmol/L (ref 3.5–5.1)
Sodium: 138 mmol/L (ref 135–145)
Total Bilirubin: 0.7 mg/dL (ref 0.3–1.2)
Total Protein: 7.4 g/dL (ref 6.5–8.1)

## 2019-03-16 LAB — C-REACTIVE PROTEIN: CRP: 6.2 mg/dL — ABNORMAL HIGH (ref <1.0)

## 2019-03-16 LAB — D-DIMER, QUANTITATIVE: D-Dimer, Quant: 0.77 ug/mL-FEU — ABNORMAL HIGH (ref 0.00–0.50)

## 2019-03-16 LAB — FERRITIN: Ferritin: 396 ng/mL — ABNORMAL HIGH (ref 24–336)

## 2019-03-16 MED ORDER — FUROSEMIDE 10 MG/ML IJ SOLN
20.0000 mg | Freq: Two times a day (BID) | INTRAMUSCULAR | Status: AC
  Administered 2019-03-16 – 2019-03-17 (×2): 20 mg via INTRAVENOUS
  Filled 2019-03-16 (×2): qty 2

## 2019-03-16 MED ORDER — ENOXAPARIN SODIUM 40 MG/0.4ML ~~LOC~~ SOLN
40.0000 mg | SUBCUTANEOUS | Status: DC
  Administered 2019-03-16 – 2019-03-17 (×2): 40 mg via SUBCUTANEOUS
  Filled 2019-03-16: qty 0.4

## 2019-03-16 MED ORDER — AZITHROMYCIN 250 MG PO TABS: 500.0000 mg | ORAL_TABLET | Freq: Every day | ORAL | Status: DC

## 2019-03-16 MED ORDER — FUROSEMIDE 10 MG/ML IJ SOLN
20.0000 mg | Freq: Two times a day (BID) | INTRAMUSCULAR | Status: DC
  Administered 2019-03-16: 20 mg via INTRAVENOUS
  Filled 2019-03-16: qty 2

## 2019-03-16 NOTE — Progress Notes (Signed)
MEDICATION RELATED CONSULT NOTE - FOLLOW UP   Pharmacy Consult for Lovenox Indication: DVT px  No Known Allergies  Patient Measurements: Height: 5\' 5"  (165.1 cm) Weight: 228 lb 2.8 oz (103.5 kg) IBW/kg (Calculated) : 61.5  Assessment: 23 yo M COVID positive. Pharmacy consulted for DVT px. BMI is ~38. CrCl > 34ml/min.  Plan:  Start enoxaparin 40mg  Xenia Q24h  Enzo Bi J 03/16/2019,12:28 PM

## 2019-03-16 NOTE — Progress Notes (Signed)
[facility] PROGRESS NOTE    Progress Note  Jon HallsJean Paul Yates  ZOX:[id number] DOB: [birth year] DOA: 03/13/2019 PCP: Patient, No Pcp Per     Brief Narrative:   Jon HallsJean Paul Yates is an 23 y.o. male no significant past medical history, [place] on [place] presents to the hospital with fever chills cough and shortness of breath, he was also experiencing chest pain due to the all coughing he has done, this started about 8 days prior to admission.  He visited the ED for the symptoms which he was treated with doxycycline.  As his symptoms continue to progress worse and he started having hemoptysis he came back to the ED tested positive for COVID-19 oxygen saturations were low he was started on 2 L of oxygen and saturations improved.  Assessment/Plan:   Principal Problem:   Acute respiratory disease due to COVID-19 virus Active Problems:   SIRS (systemic inflammatory response syndrome) (HCC)   Acute on chronic respiratory failure with hypoxia (HCC) Symptoms started on 03/07/2019 He was started empirically on azithromycin for 5 days, his QTC is preserved,  his procalcitonin less than 1 will discontinue IV Rocephin. He continues to be on 2 L of oxygen satting greater than 95% on room 2 L. He relates chest pain that is pleuritic in nature cardiac biomarkers were negative, EKG shows normal sinus rhythm normal axis LVH nonspecific T wave changes intervals are preserved. We will try to keep patient prone for more than 16 hours a day, minimum of 2 to 3 hours at a time. Try to keep him euvolemic have restricted his fluids to 1200 cc a day.,  Will give him a small dose of IV Lasix he appears euvolemic on physical exam, but he was initially placed on IV fluids on admission. Chest x-ray done on 03/15/2019 looks slightly better with a chest x-ray done on 03/11/2019. He still has significant oxygen requirement and desats with ambulation. COVID-19 markers:  CRP  6.2>9.7>8.6>6.2 Ferritin [phone]>355>351>396 D-dimer 0.53>0.62>0.77 Chest x-ray on 03/15/2019 showed bilateral lower lobes infiltrate left greater than right. Check an acute hepatitis panel and serum QuantiFERON. He has remained afebrile for the last 24 hours.  DVT prophylaxis: lovenox Family Communication:none Disposition Plan/Barrier to D/C: Unable to determine. Code Status:     Code Status Orders  (From admission, onward)         Start     Ordered   03/13/19 2046  Full code  Continuous     03/13/19 2046        Code Status History    This patient has a current code status but no historical code status.        IV Access:    Peripheral IV   Procedures and diagnostic studies:   Dg Chest Port 1 View  Result Date: 03/15/2019 CLINICAL DATA:  Chest pain.  Positive COVID-19 test EXAM: PORTABLE CHEST 1 VIEW COMPARISON:  March 13, 2019 FINDINGS: There is ill-defined patchy opacity in the lung bases. The lungs elsewhere are clear. Heart is mildly enlarged with pulmonary vascularity within normal limits. No adenopathy. No bone lesions. IMPRESSION: Patchy infiltrate in the bases, slightly more on the left than on the right. Lungs elsewhere clear. Mild cardiac enlargement. Electronically Signed   By: Bretta BangWilliam  Woodruff III M.D.   On: 03/15/2019 07:50     Medical Consultants:    None.  Anti-Infectives:   None  Subjective:    Jon HallsJean Paul Yates he relates his chest pain  is better today, it is pleuritic in nature.  Still anorexic with some nausea.  Objective:    Vitals:   03/15/19 1817 03/15/19 1955 03/15/19 2313 03/16/19 0336  BP:  (!) 126/100 119/84 (!) 112/45  Pulse: 86 86 85 81  Resp: (!) 42   (!) 25  Temp:  (!) 97.5 F (36.4 C) 98.5 F (36.9 C) (!) 97.5 F (36.4 C)  TempSrc:  Oral Oral Oral  SpO2: 97% 99% 96% 96%  Weight:      Height:        Intake/Output Summary (Last 24 hours) at 03/16/2019 0737 Last data filed at 03/16/2019 0415 Gross per 24 hour   Intake 1376.4 ml  Output 1300 ml  Net 76.4 ml   Filed Weights   03/13/19 1514 03/14/19 0430  Weight: 99.7 kg 103.5 kg    Exam: General exam: In no acute distress Respiratory system: Good air movement and clear to auscultation. Cardiovascular system: S1 & S2 heard, RRR. No JVD, murmurs, rubs, gallops or clicks.  Gastrointestinal system: Abdomen is nondistended, soft and nontender.  Central nervous system: Alert and oriented. No focal neurological deficits. Extremities: No pedal edema. Skin: No rashes, lesions or ulcers Psychiatry: Judgement and insight appear normal. Mood & affect appropriate.     Data Reviewed:    Labs: Basic Metabolic Panel: Recent Labs  Lab 03/11/19 1339 03/13/19 1619 03/14/19 0438 03/15/19 0517 03/16/19 0500  NA 137 137 137 136 138  K 4.5 4.4 3.9 3.8 4.0  CL 103 102 105 104 104  CO2 GLUCOSE 110* 133* 133* 121* 104*  BUN CREATININE 0.74 0.85 0.80 0.69 0.68  CALCIUM 9.2 8.8* 8.6* 8.4* 8.8*   GFR Estimated Creatinine Clearance: 160.4 mL/min (by C-G formula based on SCr of 0.68 mg/dL). Liver Function Tests: Recent Labs  Lab 03/13/19 1619 03/14/19 0438 03/15/19 0517 03/16/19 0500  AST ALT ALKPHOS 57 51 51 53  BILITOT 0.5 0.3 0.6 0.7  PROT 7.9 7.4 7.2 7.4  ALBUMIN 3.7 3.4* 3.2* 3.3*   No results for input(s): LIPASE, AMYLASE in the last 168 hours. No results for input(s): AMMONIA in the last 168 hours. Coagulation profile No results for input(s): INR, PROTIME in the last 168 hours.  CBC: Recent Labs  Lab 03/11/19 1339 03/13/19 1619 03/13/19 1630 03/14/19 0438  WBC 4.4 4.9  --  8.1  NEUTROABS 2.6 2.9  --   --   HGB 15.0 14.3 NOLAV 13.5  HCT 46.2 45.3  --  42.1  MCV 89.0 91.3  --  90.9  PLT 161 202  --  205   Cardiac Enzymes: Recent Labs  Lab 03/13/19 2100  TROPONINI <0.03   BNP (last 3 results) No results for input(s): PROBNP in the last 8760 hours. CBG: No  results for input(s): GLUCAP in the last 168 hours. D-Dimer: Recent Labs    03/15/19 1730 03/16/19 0500  DDIMER 0.62* 0.77*   Hgb A1c: No results for input(s): HGBA1C in the last 72 hours. Lipid Profile: Recent Labs    03/13/19 1619  TRIG 134   Thyroid function studies: No results for input(s): TSH, T4TOTAL, T3FREE, THYROIDAB in the last 72 hours.  Invalid input(s): FREET3 Anemia work up: Recent Labs    03/15/19 0517 03/15/19 1730  FERRITIN 355* 351*   Sepsis Labs: Recent Labs  Lab 03/11/19 1339 03/13/19 1619 03/13/19 1819 03/14/19 4098  PROCALCITON  --  <0.10  --   --   WBC 4.4 4.9  --  8.1  LATICACIDVEN  --  1.6 1.4  --    Microbiology Recent Results (from the past 240 hour(s))  SARS Coronavirus 2 [facility] order, Performed in [facility] hospital lab)     Status: Abnormal   Collection Time: 03/13/19  4:19 PM  Result Value Ref Range Status   SARS Coronavirus 2 POSITIVE (A) NEGATIVE Final    Comment: RESULT CALLED TO, READ BACK BY AND VERIFIED WITH: B.JESSEE,RN [id number] @1912  BY V.WILKINS (NOTE) If result is NEGATIVE SARS-CoV-2 target nucleic acids are NOT DETECTED. The SARS-CoV-2 RNA is generally detectable in upper and lower  respiratory specimens during the acute phase of infection. The lowest  concentration of SARS-CoV-2 viral copies this assay can detect is 250  copies / mL. A negative result does not preclude SARS-CoV-2 infection  and should not be used as the sole basis for treatment or other  patient management decisions.  A negative result may occur with  improper specimen collection / handling, submission of specimen other  than nasopharyngeal swab, presence of viral mutation(s) within the  areas targeted by this assay, and inadequate number of viral copies  (<250 copies / mL). A negative result must be combined with clinical  observations, patient history, and epidemiological information. If result is POSITIVE SARS-CoV-2 target nucleic acids are  DETECTED.  The SARS-CoV-2 RNA is generally detectable in upper and lower  respiratory specimens during the acute phase of infection.  Positive  results are indicative of active infection with SARS-CoV-2.  Clinical  correlation with patient history and other diagnostic information is  necessary to determine patient infection status.  Positive results do  not rule out bacterial infection or co-infection with other viruses. If result is PRESUMPTIVE POSTIVE SARS-CoV-2 nucleic acids MAY BE PRESENT.   A presumptive positive result was obtained on the submitted specimen  and confirmed on repeat testing.  While 2019 novel coronavirus  (SARS-CoV-2) nucleic acids may be present in the submitted sample  additional confirmatory testing may be necessary for epidemiological  and / or clinical management purposes  to differentiate between  SARS-CoV-2 and other Sarbecovirus currently known to infect humans.  If clinically indicated additional testing with an alternate test  methodology [phone])  is advised. The SARS-CoV-2 RNA is generally  detectable in upper and lower respiratory specimens during the acute  phase of infection. The expected result is Negative. Fact Sheet for Patients:  BoilerBrush.com.cy Fact Sheet for Healthcare Providers: https://pope.com/ This test is not yet approved or cleared by the [place] FDA and has been authorized for detection and/or diagnosis of SARS-CoV-2 by FDA under an Emergency Use Authorization (EUA).  This EUA will remain in effect (meaning this test can be used) for the duration of the COVID-19 declaration under Section 564(b)(1) of the Act, 21 U.S.C. section 360bbb-3(b)(1), unless the authorization is terminated or revoked sooner. Performed at [facility], [address] [place], [place] [address]   Blood Culture (routine x 2)     Status: None (Preliminary result)   Collection Time:  03/13/19  4:19 PM  Result Value Ref Range Status   Specimen Description   Final    BLOOD LEFT ANTECUBITAL Performed at [facility], [address] [place], [place] [address]    Special Requests   Final    BOTTLES DRAWN AEROBIC AND ANAEROBIC Blood Culture adequate volume Performed at [facility],  [address] [place], [place] [address]    Culture   Final    NO GROWTH 2 DAYS Performed at [facility], [address], [place], [place] [address]    Report Status PENDING  Incomplete  Blood Culture (routine x 2)     Status: None (Preliminary result)   Collection Time: 03/13/19  4:24 PM  Result Value Ref Range Status   Specimen Description   Final    BLOOD LEFT HAND Performed at [facility], [address], [place], [place] [address]    Special Requests   Final    BOTTLES DRAWN AEROBIC AND ANAEROBIC Blood Culture adequate volume Performed at [facility], [address], [place], [place] [address]    Culture   Final    NO GROWTH 1 DAY Performed at [facility], [address], [place], [place] [address]    Report Status PENDING  Incomplete  Respiratory Panel by PCR     Status: None   Collection Time: 03/13/19  8:42 PM  Result Value Ref Range Status   Adenovirus NOT DETECTED NOT DETECTED Final   Coronavirus 229E NOT DETECTED NOT DETECTED Final    Comment: (NOTE) The Coronavirus on the Respiratory Panel, DOES NOT test for the novel  Coronavirus (2019 nCoV)    Coronavirus HKU1 NOT DETECTED NOT DETECTED Final   Coronavirus NL63 NOT DETECTED NOT DETECTED Final   Coronavirus OC43 NOT DETECTED NOT DETECTED Final   Metapneumovirus NOT DETECTED NOT DETECTED Final   Rhinovirus / Enterovirus NOT DETECTED NOT DETECTED Final   Influenza A NOT DETECTED NOT DETECTED Final   Influenza B NOT DETECTED NOT DETECTED Final   Parainfluenza Virus 1 NOT DETECTED NOT DETECTED Final   Parainfluenza Virus 2 NOT DETECTED  NOT DETECTED Final   Parainfluenza Virus 3 NOT DETECTED NOT DETECTED Final   Parainfluenza Virus 4 NOT DETECTED NOT DETECTED Final   Respiratory Syncytial Virus NOT DETECTED NOT DETECTED Final   Bordetella pertussis NOT DETECTED NOT DETECTED Final   Chlamydophila pneumoniae NOT DETECTED NOT DETECTED Final   Mycoplasma pneumoniae NOT DETECTED NOT DETECTED Final    Comment: Performed at [facility], [address], [place], [place] [address]     Medications:   . azithromycin  250 mg Oral q1800  . ipratropium  2 puff Inhalation Q4H   Continuous Infusions: . sodium chloride Stopped (03/15/19 1847)      LOS: 3 days   Marinda Elk  [facility]  03/16/2019, 7:37 AM

## 2019-03-17 LAB — QUANTIFERON-TB GOLD PLUS (RQFGPL)
QuantiFERON Mitogen Value: 0.56 IU/mL
QuantiFERON Nil Value: 0.05 IU/mL
QuantiFERON TB1 Ag Value: 2.11 IU/mL
QuantiFERON TB2 Ag Value: 2.29 IU/mL

## 2019-03-17 LAB — C-REACTIVE PROTEIN: CRP: 5 mg/dL — ABNORMAL HIGH (ref <1.0)

## 2019-03-17 LAB — COMPREHENSIVE METABOLIC PANEL
ALT: 28 U/L (ref 0–44)
AST: 21 U/L (ref 15–41)
Albumin: 3.4 g/dL — ABNORMAL LOW (ref 3.5–5.0)
Alkaline Phosphatase: 59 U/L (ref 38–126)
Anion gap: 12 (ref 5–15)
BUN: 15 mg/dL (ref 6–20)
CO2: 30 mmol/L (ref 22–32)
Calcium: 9.2 mg/dL (ref 8.9–10.3)
Chloride: 98 mmol/L (ref 98–111)
Creatinine, Ser: 0.89 mg/dL (ref 0.61–1.24)
GFR calc Af Amer: >60 mL/min (ref >60)
GFR calc non Af Amer: >60 mL/min (ref >60)
Glucose, Bld: 111 mg/dL — ABNORMAL HIGH (ref 70–99)
Potassium: 4.3 mmol/L (ref 3.5–5.1)
Sodium: 140 mmol/L (ref 135–145)
Total Bilirubin: 0.7 mg/dL (ref 0.3–1.2)
Total Protein: 7.8 g/dL (ref 6.5–8.1)

## 2019-03-17 LAB — HEPATITIS PANEL, ACUTE
HCV Ab: <0.1 s/co ratio (ref 0.0–0.9)
Hep A IgM: NEGATIVE
Hep B C IgM: NEGATIVE
Hepatitis B Surface Ag: NEGATIVE

## 2019-03-17 LAB — D-DIMER, QUANTITATIVE: D-Dimer, Quant: 0.53 ug/mL-FEU — ABNORMAL HIGH (ref 0.00–0.50)

## 2019-03-17 LAB — FERRITIN: Ferritin: 412 ng/mL — ABNORMAL HIGH (ref 24–336)

## 2019-03-17 LAB — QUANTIFERON-TB GOLD PLUS: QuantiFERON-TB Gold Plus: POSITIVE — AB

## 2019-03-17 MED ORDER — BENZONATATE 100 MG PO CAPS
200.0000 mg | ORAL_CAPSULE | Freq: Three times a day (TID) | ORAL | Status: DC | PRN
  Filled 2019-03-17: qty 2

## 2019-03-17 MED ORDER — IPRATROPIUM BROMIDE HFA 17 MCG/ACT IN AERS
2.0000 | INHALATION_SPRAY | RESPIRATORY_TRACT | Status: DC | PRN
  Filled 2019-03-17: qty 12.9

## 2019-03-17 NOTE — Progress Notes (Signed)
Uncle and aunt notified of pt status.

## 2019-03-17 NOTE — Progress Notes (Signed)
PROGRESS NOTE                                                                                                                                                                                                             Patient Demographics:    Jon Yates, is a 23 y.o. male, DOB - [birth year], FAO:[id number]  Outpatient Primary MD for the patient is Patient, No Pcp Per    LOS - 4  Chief Complaint  Patient presents with  . Cough       Brief Narrative: Patient is a 23 y.o. male with no PMHx of presented with fever, myalgias, shortness of breath, cough-found to have COVID 19 viral pneumonia.   Subjective:    Jon Yates today is lying comfortably in bed-he can speak some English and is able to communicate.  He claims that his breathing is much better than the past few days.   Assessment  & Plan :  Acute hypoxic respiratory failure secondary to COVID 19 viral pneumonia: Improving-with supportive care-have asked RN to continue to titrate down oxygen-and see if we can get him down to room air.  We will ambulate him later today as well.  If he continues to improve-suspect he can be discharged home on 4/24.  Condition - gaurded  Family Communication  : Uncle over the phone  Code Status :  Full Code  Disposition Plan  :  Remain inpatient-if clinical improvement continues-home on 4/24  Consults  : None  DVT Prophylaxis  :  Lovenox   Lab Results  Component Value Date   PLT 205 03/14/2019    Inpatient Medications  Scheduled Meds: . enoxaparin (LOVENOX) injection  40 mg Subcutaneous Q24H   Continuous Infusions: PRN Meds:.acetaminophen **OR** acetaminophen, albuterol, chlorpheniramine-HYDROcodone, dextromethorphan-guaiFENesin, ipratropium, ondansetron **OR** ondansetron (ZOFRAN) IV, oxyCODONE  Antibiotics  :    Anti-infectives (From admission, onward)   Start     Dose/Rate Route Frequency Ordered Stop   03/16/19 1800  azithromycin (ZITHROMAX) tablet 500 mg  Status:  Discontinued     500 mg Oral Daily-1800 03/16/19 0753 03/16/19 1033   03/15/19 2045  azithromycin (ZITHROMAX) tablet 250 mg  Status:  Discontinued     250 mg Oral Daily-1800 03/15/19 2039 03/16/19 0753   03/14/19 2000  azithromycin (ZITHROMAX) tablet 250 mg  Status:  Discontinued  250 mg Oral Daily 03/13/19 2101 03/15/19 2039   03/13/19 2115  azithromycin (ZITHROMAX) tablet 500 mg     500 mg Oral Daily 03/13/19 2101 03/13/19 2246       Time Spent in minutes  25   Jeoffrey Massed M.D on 03/17/2019 at 12:10 PM  To page go to [email or web address] - password [facility]  [facility] -  Office  [phone]  Admit date - 03/13/2019    4    Objective:   Vitals:   03/17/19 0800 03/17/19 0852 03/17/19 0900 03/17/19 1000  BP: 113/89 (!) 108/94    Pulse: 72 84 74 90  Resp: 20 (!) 22 (!) 22 (!) 26  Temp:  98.4 F (36.9 C)    TempSrc:  Oral    SpO2: 95% 95% 97% 94%  Weight:      Height:        Wt Readings from Last 3 Encounters:  03/14/19 103.5 kg  03/11/19 99.8 kg     Intake/Output Summary (Last 24 hours) at 03/17/2019 1210 Last data filed at 03/17/2019 1000 Gross per 24 hour  Intake 900 ml  Output 1675 ml  Net -775 ml     Physical Exam Gen Exam: Awake-alert-not any distress.  Lying comfortably in bed. HEENT:atraumatic, normocephalic Chest: B/L clear to auscultation anteriorly CVS:S1S2 regular Abdomen:soft non tender, non distended Extremities:no edema Neurology: Nonfocal Skin: no rash   Data Review:    CBC Recent Labs  Lab 03/11/19 1339 03/13/19 1619 03/13/19 1630 03/14/19 0438  WBC 4.4 4.9  --  8.1  HGB 15.0 14.3 NOLAV 13.5  HCT 46.2 45.3  --  42.1  PLT 161 202  --  205  MCV 89.0 91.3  --  90.9  MCH 28.9 28.8  --  29.2  MCHC 32.5 31.6  --  32.1  RDW 12.9 13.1  --  13.2  LYMPHSABS 1.2 1.4  --   --   MONOABS 0.7 0.6  --   --   EOSABS 0.0 0.0  --   --   BASOSABS 0.0 0.0  --   --      Chemistries  Recent Labs  Lab 03/13/19 1619 03/14/19 0438 03/15/19 0517 03/16/19 0500 03/17/19 0500  NA 137 137 136 138 140  K 4.4 3.9 3.8 4.0 4.3  CL 102 105 104 104 98  CO2 26 24 25 25 30   GLUCOSE 133* 133* 121* 104* 111*  BUN 9 10 8 10 15   CREATININE 0.85 0.80 0.69 0.68 0.89  CALCIUM 8.8* 8.6* 8.4* 8.8* 9.2  AST 22 21 25 23 21   ALT 20 19 23 25 28   ALKPHOS 57 51 51 53 59  BILITOT 0.5 0.3 0.6 0.7 0.7   ------------------------------------------------------------------------------------------------------------------ No results for input(s): CHOL, HDL, LDLCALC, TRIG, CHOLHDL, LDLDIRECT in the last 72 hours.  No results found for: HGBA1C ------------------------------------------------------------------------------------------------------------------ No results for input(s): TSH, T4TOTAL, T3FREE, THYROIDAB in the last 72 hours.  Invalid input(s): FREET3 ------------------------------------------------------------------------------------------------------------------ Recent Labs    03/16/19 0500 03/17/19 0500  FERRITIN 396* 412*    Coagulation profile No results for input(s): INR, PROTIME in the last 168 hours.  Recent Labs    03/16/19 0500 03/17/19 0500  DDIMER 0.77* 0.53*    Cardiac Enzymes Recent Labs  Lab 03/13/19 2100  TROPONINI <0.03   ------------------------------------------------------------------------------------------------------------------    Component Value Date/Time   BNP 20.9 03/13/2019 2100    Micro Results Recent Results (from the past 240 hour(s))  SARS Coronavirus 2 [facility] order, Performed in  [facility] hospital lab)     Status: Abnormal   Collection Time: 03/13/19  4:19 PM  Result Value Ref Range Status   SARS Coronavirus 2 POSITIVE (A) NEGATIVE Final    Comment: RESULT CALLED TO, READ BACK BY AND VERIFIED WITH: B.JESSEE,RN [phone] @1912  BY V.WILKINS (NOTE) If result is NEGATIVE SARS-CoV-2 target nucleic acids are NOT  DETECTED. The SARS-CoV-2 RNA is generally detectable in upper and lower  respiratory specimens during the acute phase of infection. The lowest  concentration of SARS-CoV-2 viral copies this assay can detect is 250  copies / mL. A negative result does not preclude SARS-CoV-2 infection  and should not be used as the sole basis for treatment or other  patient management decisions.  A negative result may occur with  improper specimen collection / handling, submission of specimen other  than nasopharyngeal swab, presence of viral mutation(s) within the  areas targeted by this assay, and inadequate number of viral copies  (<250 copies / mL). A negative result must be combined with clinical  observations, patient history, and epidemiological information. If result is POSITIVE SARS-CoV-2 target nucleic acids are DETECTED.  The SARS-CoV-2 RNA is generally detectable in upper and lower  respiratory specimens during the acute phase of infection.  Positive  results are indicative of active infection with SARS-CoV-2.  Clinical  correlation with patient history and other diagnostic information is  necessary to determine patient infection status.  Positive results do  not rule out bacterial infection or co-infection with other viruses. If result is PRESUMPTIVE POSTIVE SARS-CoV-2 nucleic acids MAY BE PRESENT.   A presumptive positive result was obtained on the submitted specimen  and confirmed on repeat testing.  While 2019 novel coronavirus  (SARS-CoV-2) nucleic acids may be present in the submitted sample  additional confirmatory testing may be necessary for epidemiological  and / or clinical management purposes  to differentiate between  SARS-CoV-2 and other Sarbecovirus currently known to infect humans.  If clinically indicated additional testing with an alternate test  methodology [phone]  is advised. The SARS-CoV-2 RNA is generally  detectable in upper and lower respiratory specimens during  the acute  phase of infection. The expected result is Negative. Fact Sheet for Patients:  BoilerBrush.com.cyhttps://www.fda.gov/media/136312/download Fact Sheet for Healthcare Providers: https://pope.com/https://www.fda.gov/media/136313/download This test is not yet approved or cleared by the [place] FDA and has been authorized for detection and/or diagnosis of SARS-CoV-2 by FDA under an Emergency Use Authorization (EUA).  This EUA will remain in effect (meaning this test can be used) for the duration of the COVID-19 declaration under Section 564(b)(1) of the Act, 21 U.S.C. section 360bbb-3(b)(1), unless the authorization is terminated or revoked sooner. Performed at [facility], [address] [place], [place] [address]   Blood Culture (routine x 2)     Status: None (Preliminary result)   Collection Time: 03/13/19  4:19 PM  Result Value Ref Range Status   Specimen Description   Final    BLOOD LEFT ANTECUBITAL Performed at [facility], [address] [place], [place] [address]    Special Requests   Final    BOTTLES DRAWN AEROBIC AND ANAEROBIC Blood Culture adequate volume Performed at [facility], [address] [place], [place] [address]    Culture   Final    NO GROWTH 4 DAYS Performed at [facility], [address] [place], [place] [address]    Report Status PENDING  Incomplete  Blood Culture (routine x  2)     Status: None (Preliminary result)   Collection Time: 03/13/19  4:24 PM  Result Value Ref Range Status   Specimen Description   Final    BLOOD LEFT HAND Performed at [facility], [address] [place], [place] [address]    Special Requests   Final    BOTTLES DRAWN AEROBIC AND ANAEROBIC Blood Culture adequate volume Performed at [facility], [address], [place], [place] [address]    Culture   Final    NO GROWTH 3 DAYS Performed at [facility], [address], [place], [place] [address]     Report Status PENDING  Incomplete  Respiratory Panel by PCR     Status: None   Collection Time: 03/13/19  8:42 PM  Result Value Ref Range Status   Adenovirus NOT DETECTED NOT DETECTED Final   Coronavirus 229E NOT DETECTED NOT DETECTED Final    Comment: (NOTE) The Coronavirus on the Respiratory Panel, DOES NOT test for the novel  Coronavirus (2019 nCoV)    Coronavirus HKU1 NOT DETECTED NOT DETECTED Final   Coronavirus NL63 NOT DETECTED NOT DETECTED Final   Coronavirus OC43 NOT DETECTED NOT DETECTED Final   Metapneumovirus NOT DETECTED NOT DETECTED Final   Rhinovirus / Enterovirus NOT DETECTED NOT DETECTED Final   Influenza A NOT DETECTED NOT DETECTED Final   Influenza B NOT DETECTED NOT DETECTED Final   Parainfluenza Virus 1 NOT DETECTED NOT DETECTED Final   Parainfluenza Virus 2 NOT DETECTED NOT DETECTED Final   Parainfluenza Virus 3 NOT DETECTED NOT DETECTED Final   Parainfluenza Virus 4 NOT DETECTED NOT DETECTED Final   Respiratory Syncytial Virus NOT DETECTED NOT DETECTED Final   Bordetella pertussis NOT DETECTED NOT DETECTED Final   Chlamydophila pneumoniae NOT DETECTED NOT DETECTED Final   Mycoplasma pneumoniae NOT DETECTED NOT DETECTED Final    Comment: Performed at [facility], [address] [place], [place] [address]    Radiology Reports Dg Chest Port 1 View  Result Date: 03/15/2019 CLINICAL DATA:  Chest pain.  Positive COVID-19 test EXAM: PORTABLE CHEST 1 VIEW COMPARISON:  March 13, 2019 FINDINGS: There is ill-defined patchy opacity in the lung bases. The lungs elsewhere are clear. Heart is mildly enlarged with pulmonary vascularity within normal limits. No adenopathy. No bone lesions. IMPRESSION: Patchy infiltrate in the bases, slightly more on the left than on the right. Lungs elsewhere clear. Mild cardiac enlargement. Electronically Signed   By: Bretta Bang III M.D.   On: 03/15/2019 07:50   Dg Chest Port 1 View  Result Date: 03/13/2019 CLINICAL DATA:   Shortness of breath and cough EXAM: PORTABLE CHEST 1 VIEW COMPARISON:  March 11, 2019 FINDINGS: Stable cardiomegaly. No pneumothorax. No pulmonary nodules or masses. No focal infiltrates. Mild bibasilar atelectasis. IMPRESSION: No active disease. Electronically Signed   By: Gerome Sam III M.D   On: 03/13/2019 16:23   Dg Chest Portable 1 View  Result Date: 03/11/2019 CLINICAL DATA:  Shortness of breath with cough and fever EXAM: PORTABLE CHEST 1 VIEW COMPARISON:  None. FINDINGS: There is subtle increased opacity in the right base. The lungs elsewhere are clear. Heart size and pulmonary vascularity are normal. No adenopathy. No bone lesions. IMPRESSION: Subtle increased opacity in the right base, suspicious for early pneumonia. Lungs elsewhere clear. No adenopathy. Electronically Signed   By: Bretta Bang III M.D.   On: 03/11/2019 14:13

## 2019-03-17 NOTE — Progress Notes (Signed)
Appointment for PCP follow at [facility] 04/04/2019 at 0920 AM.

## 2019-03-18 ENCOUNTER — Telehealth (INDEPENDENT_AMBULATORY_CARE_PROVIDER_SITE_OTHER): Payer: Self-pay | Admitting: General Practice

## 2019-03-18 LAB — CULTURE, BLOOD (ROUTINE X 2)
Culture: NO GROWTH
Special Requests: ADEQUATE

## 2019-03-18 MED ORDER — ALBUTEROL SULFATE HFA 108 (90 BASE) MCG/ACT IN AERS: 2.0000 | INHALATION_SPRAY | RESPIRATORY_TRACT | 0 refills | Status: DC | PRN

## 2019-03-18 MED ORDER — BENZONATATE 200 MG PO CAPS: 200.0000 mg | ORAL_CAPSULE | Freq: Three times a day (TID) | ORAL | 0 refills | Status: DC | PRN

## 2019-03-18 NOTE — Telephone Encounter (Signed)
Pt d/c today. Sent mychart link via text. Will f/u with pt next week regarding monitoring.

## 2019-03-18 NOTE — Discharge Summary (Addendum)
PATIENT DETAILS Name: Jon Yates ZOXWRUEAV Age: 23 y.o. Sex: male Date of Birth: 1996-03-27 MRN: [id number]. Admitting Physician: Lorretta Harp, MD NWG:[id number], No Pcp Per  Admit Date: 03/13/2019 Discharge date: 03/18/2019  Recommendations for Outpatient Follow-up:  1. Follow up with PCP in 1-2 weeks 2. Please obtain BMP/CBC in one week 3. Needs to follow-up with the health department for positive Gold QuantiFERON-please refer  Admitted From:  Home  Disposition: Home    Home Health: No  Equipment/Devices: None  Discharge Condition: Stable  CODE STATUS: FULL CODE  Diet recommendation:  Regular   Brief Summary: See H&P, Labs, Consult and Test reports for all details in brief, Patient is a 23 y.o. male with no PMHx of presented with fever, myalgias, shortness of breath, cough-found to have COVID 19 viral pneumonia.  Brief Hospital Course: Acute hypoxic respiratory failure secondary to COVID 19 viral pneumonia: Improved with supportive care-titrated off to room air yesterday, continues to do well-remains on room air this morning.  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  Procedures/Studies: None  Discharge Diagnoses:  Principal Problem:   Acute respiratory disease due to COVID-19 virus Active Problems:   SIRS (systemic inflammatory response syndrome) (HCC)   Acute on chronic respiratory failure with hypoxia [facility])   Discharge Instructions:  Activity:  As tolerated   Discharge Instructions    Call MD for:  difficulty breathing, headache or visual disturbances   Complete by:  As directed    Call MD for:  persistant nausea and vomiting   Complete by:  As directed    Call MD for:  temperature >100.4   Complete by:  As directed    Diet general    Complete by:  As directed    Discharge instructions   Complete by:  As directed    Follow with Primary MD  Patient, No Pcp Per  and other consultant's as instructed your Hospitalist MD  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  Please get a complete blood count and chemistry panel checked by your Primary MD at your next visit, and again as instructed by your Primary MD.  Get Medicines reviewed and adjusted: Please take all your medications with you for your next visit with your Primary MD  Laboratory/radiological data: Please request your Primary MD to go over all hospital tests and procedure/radiological results at the follow up, please ask your Primary MD to get all Hospital records sent to his/her office.  In some cases, they will be blood work, cultures and biopsy results pending at the time of your discharge. Please request that your primary care M.D. follows up on these results.  Also Note the following: If you experience worsening of your admission symptoms, develop shortness of breath, life threatening emergency, suicidal or homicidal thoughts you must seek medical attention immediately by calling 911 or calling your MD immediately  if symptoms less severe.  You must read complete instructions/literature along with all the possible adverse reactions/side effects for all the Medicines you take and that have been prescribed to you. Take any new Medicines after you have completely understood and accpet all the possible adverse reactions/side effects.   Do not drive when taking Pain medications or sleeping medications (Benzodaizepines)  Do not take more than prescribed Pain, Sleep and  Anxiety Medications. It is not advisable to combine anxiety,sleep and pain medications without talking with your primary care practitioner  Special Instructions: If you have smoked or chewed Tobacco  in the last 2 yrs please stop  smoking, stop any regular Alcohol  and or any Recreational drug use.  Wear Seat belts while driving.  Please note: You were cared for by a hospitalist during your hospital stay. Once you are discharged, your primary care physician will handle any further medical issues. Please note that NO REFILLS for any discharge medications will be authorized once you are discharged, as it is imperative that you return to your primary care physician (or establish a relationship with a primary care physician if you do not have one) for your post hospital discharge needs so that they can reassess your need for medications and monitor your lab values.     ?   Person Under Monitoring Name: Jon Yates  Location: [address] [place] [address]   Infection Prevention Recommendations for Individuals Confirmed to have, or Being Evaluated for, 2019 Novel Coronavirus (COVID-19) Infection Who Receive Care at Home  Individuals who are confirmed to have, or are being evaluated for, COVID-19 should follow the prevention steps below until a healthcare provider or local or state health department says they can return to normal activities.  Stay home except to get medical care You should restrict activities outside your home, except for getting medical care. Do not go to work, school, or public areas, and do not use public transportation or taxis.  Call ahead before visiting your doctor Before your medical appointment, call the healthcare provider and tell them that you have, or are being evaluated for, COVID-19 infection. This will help the healthcare provider's office take steps to keep other people from getting infected. Ask your healthcare provider to call the local or state health department.  Monitor your symptoms Seek prompt medical attention if your illness is worsening (e.g., difficulty breathing). Before going to your medical appointment, call the healthcare provider and tell them  that you have, or are being evaluated for, COVID-19 infection. Ask your healthcare provider to call the local or state health department.  Wear a facemask You should wear a facemask that covers your nose and mouth when you are in the same room with other people and when you visit a healthcare provider. People who live with or visit you should also wear a facemask while they are in the same room with you.  Separate yourself from other people in your home As much as possible, you should stay in a different room from other people in your home. Also, you should use a separate bathroom, if available.  Avoid sharing household items You should not share dishes, drinking glasses, cups, eating utensils, towels, bedding, or other items with other people in your home. After using these items, you should wash them thoroughly with soap and water.  Cover your coughs and sneezes Cover your mouth and nose with a tissue when you cough or sneeze, or you can cough or sneeze into your sleeve. Throw used tissues in a lined trash can, and immediately wash your hands with soap and water for at least 20 seconds or use an alcohol-based hand rub.  Wash your [employer] your hands often and thoroughly with soap and water for at least 20 seconds. You can use an alcohol-based hand sanitizer if soap and water are not available and if your hands are not visibly dirty. Avoid  touching your eyes, nose, and mouth with unwashed hands.   Prevention Steps for Caregivers and Household Members of Individuals Confirmed to have, or Being Evaluated for, COVID-19 Infection Being Cared for in the Home  If you live with, or provide care at home for, a person confirmed to have, or being evaluated for, COVID-19 infection please follow these guidelines to prevent infection:  Follow healthcare provider's instructions Make sure that you understand and can help the patient follow any healthcare provider instructions for all care.   Provide for the patient's basic needs You should help the patient with basic needs in the home and provide support for getting groceries, prescriptions, and other personal needs.  Monitor the patient's symptoms If they are getting sicker, call his or her medical provider and tell them that the patient has, or is being evaluated for, COVID-19 infection. This will help the healthcare provider's office take steps to keep other people from getting infected. Ask the healthcare provider to call the local or state health department.  Limit the number of people who have contact with the patient If possible, have only one caregiver for the patient. Other household members should stay in another home or place of residence. If this is not possible, they should stay in another room, or be separated from the patient as much as possible. Use a separate bathroom, if available. Restrict visitors who do not have an essential need to be in the home.  Keep older adults, very young children, and other sick people away from the patient Keep older adults, very young children, and those who have compromised immune systems or chronic health conditions away from the patient. This includes people with chronic heart, lung, or kidney conditions, diabetes, and cancer.  Ensure good ventilation Make sure that shared spaces in the home have good air flow, such as from an air conditioner or an opened window, weather permitting.  Wash your hands often Wash your hands often and thoroughly with soap and water for at least 20 seconds. You can use an alcohol based hand sanitizer if soap and water are not available and if your hands are not visibly dirty. Avoid touching your eyes, nose, and mouth with unwashed hands. Use disposable paper towels to dry your hands. If not available, use dedicated cloth towels and replace them when they become wet.  Wear a facemask and gloves Wear a disposable facemask at all times in the room  and gloves when you touch or have contact with the patient's blood, body fluids, and/or secretions or excretions, such as sweat, saliva, sputum, nasal mucus, vomit, urine, or feces.  Ensure the mask fits over your nose and mouth tightly, and do not touch it during use. Throw out disposable facemasks and gloves after using them. Do not reuse. Wash your hands immediately after removing your facemask and gloves. If your personal clothing becomes contaminated, carefully remove clothing and launder. Wash your hands after handling contaminated clothing. Place all used disposable facemasks, gloves, and other waste in a lined container before disposing them with other household waste. Remove gloves and wash your hands immediately after handling these items.  Do not share dishes, glasses, or other household items with the patient Avoid sharing household items. You should not share dishes, drinking glasses, cups, eating utensils, towels, bedding, or other items with a patient who is confirmed to have, or being evaluated for, COVID-19 infection. After the person uses these items, you should wash them thoroughly with soap and water.  [employer]  thoroughly Immediately remove and wash clothes or bedding that have blood, body fluids, and/or secretions or excretions, such as sweat, saliva, sputum, nasal mucus, vomit, urine, or feces, on them. Wear gloves when handling laundry from the patient. Read and follow directions on labels of laundry or clothing items and detergent. In general, wash and dry with the warmest temperatures recommended on the label.  Clean all areas the individual has used often Clean all touchable surfaces, such as counters, tabletops, doorknobs, bathroom fixtures, toilets, phones, keyboards, tablets, and bedside tables, every day. Also, clean any surfaces that may have blood, body fluids, and/or secretions or excretions on them. Wear gloves when cleaning surfaces the patient has come in  contact with. Use a diluted bleach solution (e.g., dilute bleach with 1 part bleach and 10 parts water) or a household disinfectant with a label that says EPA-registered for coronaviruses. To make a bleach solution at home, add 1 tablespoon of bleach to 1 quart (4 cups) of water. For a larger supply, add  cup of bleach to 1 gallon (16 cups) of water. Read labels of cleaning products and follow recommendations provided on product labels. Labels contain instructions for safe and effective use of the cleaning product including precautions you should take when applying the product, such as wearing gloves or eye protection and making sure you have good ventilation during use of the product. Remove gloves and wash hands immediately after cleaning.  Monitor yourself for signs and symptoms of illness Caregivers and household members are considered close contacts, should monitor their health, and will be asked to limit movement outside of the home to the extent possible. Follow the monitoring steps for close contacts listed on the symptom monitoring form.   ? If you have additional questions, contact your local health department or call the epidemiologist on call at [phone] (available 24/7). ? This guidance is subject to change. For the most up-to-date guidance from [facility], please refer to their website: [email or web address]   Increase activity slowly   Complete by:  As directed    MyChart COVID-19 home monitoring program   Complete by:  Mar 18, 2019    Is the patient willing to use a smartphone for remote monitoring via the MyChart app?:  No     Allergies as of 03/18/2019   No Known Allergies     Medication List    STOP taking these medications   doxycycline 100 MG capsule Commonly known as:  VIBRAMYCIN     TAKE these medications   acetaminophen 325 MG tablet Commonly known as:  Tylenol Take 2 tablets (650 mg total) by mouth every 6  (six) hours as needed. Do not take more than 4000mg  of tylenol per day   albuterol 108 (90 Base) MCG/ACT inhaler Commonly known as:  VENTOLIN HFA Inhale 2 puffs into the lungs every 4 (four) hours as needed for wheezing or shortness of breath.   benzonatate 200 MG capsule Commonly known as:  TESSALON Take 1 capsule (200 mg total) by mouth 3 (three) times daily as needed for cough.   ibuprofen 600 MG tablet Commonly known as:  ADVIL Take 1 tablet (600 mg total) by mouth every 6 (six) hours as needed.      Follow-up Information    Mike Gip, FNP Follow up on 04/04/2019.   Specialty:  Family Medicine Why:  appt at 9:20 am Contact information: [address] [place] [address] [phone]          No  Known Allergies  Consultations:   None  Other Procedures/Studies: Dg Chest Port 1 View  Result Date: 03/15/2019 CLINICAL DATA:  Chest pain.  Positive COVID-19 test EXAM: PORTABLE CHEST 1 VIEW COMPARISON:  March 13, 2019 FINDINGS: There is ill-defined patchy opacity in the lung bases. The lungs elsewhere are clear. Heart is mildly enlarged with pulmonary vascularity within normal limits. No adenopathy. No bone lesions. IMPRESSION: Patchy infiltrate in the bases, slightly more on the left than on the right. Lungs elsewhere clear. Mild cardiac enlargement. Electronically Signed   By: Bretta Bang III M.D.   On: 03/15/2019 07:50   Dg Chest Port 1 View  Result Date: 03/13/2019 CLINICAL DATA:  Shortness of breath and cough EXAM: PORTABLE CHEST 1 VIEW COMPARISON:  March 11, 2019 FINDINGS: Stable cardiomegaly. No pneumothorax. No pulmonary nodules or masses. No focal infiltrates. Mild bibasilar atelectasis. IMPRESSION: No active disease. Electronically Signed   By: Gerome Sam III M.D   On: 03/13/2019 16:23   Dg Chest Portable 1 View  Result Date: 03/11/2019 CLINICAL DATA:  Shortness of breath with cough and fever EXAM: PORTABLE CHEST 1 VIEW COMPARISON:  None.  FINDINGS: There is subtle increased opacity in the right base. The lungs elsewhere are clear. Heart size and pulmonary vascularity are normal. No adenopathy. No bone lesions. IMPRESSION: Subtle increased opacity in the right base, suspicious for early pneumonia. Lungs elsewhere clear. No adenopathy. Electronically Signed   By: Bretta Bang III M.D.   On: 03/11/2019 14:13     TODAY-DAY OF DISCHARGE:  Subjective:   Sierra Olinde today has no headache,no chest abdominal pain,no new weakness tingling or numbness, feels much better wants to go home today.   Objective:   Blood pressure 102/83, pulse 96, temperature 97.6 F (36.4 C), temperature source Oral, resp. rate (!) 21, height  (1.651 m), weight 103.5 kg, SpO2 93 %.  Intake/Output Summary (Last 24 hours) at 03/18/2019 1023 Last data filed at 03/18/2019 0230 Gross per 24 hour  Intake -  Output 700 ml  Net -700 ml   Filed Weights   03/13/19 1514 03/14/19 0430  Weight: 99.7 kg 103.5 kg    Exam:Per my partner Dr Doristine Church exam Awake Alert, Oriented *3, No new F.N deficits, Normal affect [place].AT,PERRAL Supple Neck,No JVD, No cervical lymphadenopathy appriciated.  Symmetrical Chest wall movement, Good air movement bilaterally, CTAB RRR,No Gallops,Rubs or new Murmurs, No Parasternal Heave +ve B.Sounds, Abd Soft, Non tender, No organomegaly appriciated, No rebound -guarding or rigidity. No Cyanosis, Clubbing or edema, No new Rash or bruise   PERTINENT RADIOLOGIC STUDIES: Dg Chest Port 1 View  Result Date: 03/15/2019 CLINICAL DATA:  Chest pain.  Positive COVID-19 test EXAM: PORTABLE CHEST 1 VIEW COMPARISON:  March 13, 2019 FINDINGS: There is ill-defined patchy opacity in the lung bases. The lungs elsewhere are clear. Heart is mildly enlarged with pulmonary vascularity within normal limits. No adenopathy. No bone lesions. IMPRESSION: Patchy infiltrate in the bases, slightly more on the left than on the right. Lungs elsewhere clear.  Mild cardiac enlargement. Electronically Signed   By: Bretta Bang III M.D.   On: 03/15/2019 07:50   Dg Chest Port 1 View  Result Date: 03/13/2019 CLINICAL DATA:  Shortness of breath and cough EXAM: PORTABLE CHEST 1 VIEW COMPARISON:  March 11, 2019 FINDINGS: Stable cardiomegaly. No pneumothorax. No pulmonary nodules or masses. No focal infiltrates. Mild bibasilar atelectasis. IMPRESSION: No active disease. Electronically Signed   By: Gerome Sam III M.D   On:  03/13/2019 16:23   Dg Chest Portable 1 View  Result Date: 03/11/2019 CLINICAL DATA:  Shortness of breath with cough and fever EXAM: PORTABLE CHEST 1 VIEW COMPARISON:  None. FINDINGS: There is subtle increased opacity in the right base. The lungs elsewhere are clear. Heart size and pulmonary vascularity are normal. No adenopathy. No bone lesions. IMPRESSION: Subtle increased opacity in the right base, suspicious for early pneumonia. Lungs elsewhere clear. No adenopathy. Electronically Signed   By: Bretta Bang III M.D.   On: 03/11/2019 14:13     PERTINENT LAB RESULTS: CBC: No results for input(s): WBC, HGB, HCT, PLT in the last 72 hours. CMET CMP     Component Value Date/Time   NA 140 03/17/2019 0500   K 4.3 03/17/2019 0500   CL 98 03/17/2019 0500   CO2 30 03/17/2019 0500   GLUCOSE 111 (H) 03/17/2019 0500   BUN 15 03/17/2019 0500   CREATININE 0.89 03/17/2019 0500   CALCIUM 9.2 03/17/2019 0500   PROT 7.8 03/17/2019 0500   ALBUMIN 3.4 (L) 03/17/2019 0500   AST 21 03/17/2019 0500   ALT 28 03/17/2019 0500   ALKPHOS 59 03/17/2019 0500   BILITOT 0.7 03/17/2019 0500   GFRNONAA >60 03/17/2019 0500   GFRAA >60 03/17/2019 0500    GFR Estimated Creatinine Clearance: 144.2 mL/min (by C-G formula based on SCr of 0.89 mg/dL). No results for input(s): LIPASE, AMYLASE in the last 72 hours. No results for input(s): CKTOTAL, CKMB, CKMBINDEX, TROPONINI in the last 72 hours. Invalid input(s): POCBNP Recent Labs    03/16/19  0500 03/17/19 0500  DDIMER 0.77* 0.53*   No results for input(s): HGBA1C in the last 72 hours. No results for input(s): CHOL, HDL, LDLCALC, TRIG, CHOLHDL, LDLDIRECT in the last 72 hours. No results for input(s): TSH, T4TOTAL, T3FREE, THYROIDAB in the last 72 hours.  Invalid input(s): FREET3 Recent Labs    03/16/19 0500 03/17/19 0500  FERRITIN 396* 412*   Coags: No results for input(s): INR in the last 72 hours.  Invalid input(s): PT Microbiology: Recent Results (from the past 240 hour(s))  SARS Coronavirus 2 [facility] order, Performed in [facility] hospital lab)     Status: Abnormal   Collection Time: 03/13/19  4:19 PM  Result Value Ref Range Status   SARS Coronavirus 2 POSITIVE (A) NEGATIVE Final    Comment: RESULT CALLED TO, READ BACK BY AND VERIFIED WITH: B.JESSEE,RN [id number] @1912  BY V.WILKINS (NOTE) If result is NEGATIVE SARS-CoV-2 target nucleic acids are NOT DETECTED. The SARS-CoV-2 RNA is generally detectable in upper and lower  respiratory specimens during the acute phase of infection. The lowest  concentration of SARS-CoV-2 viral copies this assay can detect is 250  copies / mL. A negative result does not preclude SARS-CoV-2 infection  and should not be used as the sole basis for treatment or other  patient management decisions.  A negative result may occur with  improper specimen collection / handling, submission of specimen other  than nasopharyngeal swab, presence of viral mutation(s) within the  areas targeted by this assay, and inadequate number of viral copies  (<250 copies / mL). A negative result must be combined with clinical  observations, patient history, and epidemiological information. If result is POSITIVE SARS-CoV-2 target nucleic acids are DETECTED.  The SARS-CoV-2 RNA is generally detectable in upper and lower  respiratory specimens during the acute phase of infection.  Positive  results are indicative of active infection with SARS-CoV-2.   Clinical  correlation with patient  history and other diagnostic information is  necessary to determine patient infection status.  Positive results do  not rule out bacterial infection or co-infection with other viruses. If result is PRESUMPTIVE POSTIVE SARS-CoV-2 nucleic acids MAY BE PRESENT.   A presumptive positive result was obtained on the submitted specimen  and confirmed on repeat testing.  While 2019 novel coronavirus  (SARS-CoV-2) nucleic acids may be present in the submitted sample  additional confirmatory testing may be necessary for epidemiological  and / or clinical management purposes  to differentiate between  SARS-CoV-2 and other Sarbecovirus currently known to infect humans.  If clinically indicated additional testing with an alternate test  methodology [phone])  is advised. The SARS-CoV-2 RNA is generally  detectable in upper and lower respiratory specimens during the acute  phase of infection. The expected result is Negative. Fact Sheet for Patients:  BoilerBrush.com.cy Fact Sheet for Healthcare Providers: https://pope.com/ This test is not yet approved or cleared by the [place] FDA and has been authorized for detection and/or diagnosis of SARS-CoV-2 by FDA under an Emergency Use Authorization (EUA).  This EUA will remain in effect (meaning this test can be used) for the duration of the COVID-19 declaration under Section 564(b)(1) of the Act, 21 U.S.C. section 360bbb-3(b)(1), unless the authorization is terminated or revoked sooner. Performed at [facility], [address] [place], [place] [address]   Blood Culture (routine x 2)     Status: None   Collection Time: 03/13/19  4:19 PM  Result Value Ref Range Status   Specimen Description   Final    BLOOD LEFT ANTECUBITAL Performed at [facility], [address] [place], [place] [address]    Special Requests   Final     BOTTLES DRAWN AEROBIC AND ANAEROBIC Blood Culture adequate volume Performed at [facility], [address] [place], [place] [address]    Culture   Final    NO GROWTH 5 DAYS Performed at [facility], [address], [place], [place] [address]    Report Status 03/18/2019 FINAL  Final  Blood Culture (routine x 2)     Status: None (Preliminary result)   Collection Time: 03/13/19  4:24 PM  Result Value Ref Range Status   Specimen Description   Final    BLOOD LEFT HAND Performed at [facility], [address] [place], [place] [address]    Special Requests   Final    BOTTLES DRAWN AEROBIC AND ANAEROBIC Blood Culture adequate volume Performed at [facility], [address] [place], [place] [address]    Culture   Final    NO GROWTH 4 DAYS Performed at [facility], [address] [place], [place] [address]    Report Status PENDING  Incomplete  Respiratory Panel by PCR     Status: None   Collection Time: 03/13/19  8:42 PM  Result Value Ref Range Status   Adenovirus NOT DETECTED NOT DETECTED Final   Coronavirus 229E NOT DETECTED NOT DETECTED Final    Comment: (NOTE) The Coronavirus on the Respiratory Panel, DOES NOT test for the novel  Coronavirus (2019 nCoV)    Coronavirus HKU1 NOT DETECTED NOT DETECTED Final   Coronavirus NL63 NOT DETECTED NOT DETECTED Final   Coronavirus OC43 NOT DETECTED NOT DETECTED Final   Metapneumovirus NOT DETECTED NOT DETECTED Final   Rhinovirus / Enterovirus NOT DETECTED NOT DETECTED Final   Influenza A NOT DETECTED NOT DETECTED  Final   Influenza B NOT DETECTED NOT DETECTED Final   Parainfluenza Virus 1 NOT DETECTED NOT DETECTED Final   Parainfluenza Virus 2 NOT DETECTED NOT DETECTED Final   Parainfluenza Virus 3 NOT DETECTED NOT DETECTED Final   Parainfluenza Virus 4 NOT DETECTED NOT DETECTED Final   Respiratory Syncytial Virus NOT DETECTED NOT DETECTED Final   Bordetella pertussis NOT  DETECTED NOT DETECTED Final   Chlamydophila pneumoniae NOT DETECTED NOT DETECTED Final   Mycoplasma pneumoniae NOT DETECTED NOT DETECTED Final    Comment: Performed at [facility], [address] [place], [place] [address]    FURTHER DISCHARGE INSTRUCTIONS:  Get Medicines reviewed and adjusted: Please take all your medications with you for your next visit with your Primary MD  Laboratory/radiological data: Please request your Primary MD to go over all hospital tests and procedure/radiological results at the follow up, please ask your Primary MD to get all Hospital records sent to his/her office.  In some cases, they will be blood work, cultures and biopsy results pending at the time of your discharge. Please request that your primary care M.D. goes through all the records of your hospital data and follows up on these results.  Also Note the following: If you experience worsening of your admission symptoms, develop shortness of breath, life threatening emergency, suicidal or homicidal thoughts you must seek medical attention immediately by calling 911 or calling your MD immediately  if symptoms less severe.  You must read complete instructions/literature along with all the possible adverse reactions/side effects for all the Medicines you take and that have been prescribed to you. Take any new Medicines after you have completely understood and accpet all the possible adverse reactions/side effects.   Do not drive when taking Pain medications or sleeping medications (Benzodaizepines)  Do not take more than prescribed Pain, Sleep and Anxiety Medications. It is not advisable to combine anxiety,sleep and pain medications without talking with your primary care practitioner  Special Instructions: If you have smoked or chewed Tobacco  in the last 2 yrs please stop smoking, stop any regular Alcohol  and or any Recreational drug use.  Wear Seat belts while driving.  Please note: You were  cared for by a hospitalist during your hospital stay. Once you are discharged, your primary care physician will handle any further medical issues. Please note that NO REFILLS for any discharge medications will be authorized once you are discharged, as it is imperative that you return to your primary care physician (or establish a relationship with a primary care physician if you do not have one) for your post hospital discharge needs so that they can reassess your need for medications and monitor your lab values.  Total Time spent coordinating discharge including counseling, education and face to face time equals 25  minutes.  SignedJeoffrey Massed: Rande Dario 03/18/2019 10:23 AM

## 2019-03-18 NOTE — Discharge Instructions (Signed)
Person Under Monitoring Name: Jon Yates  Location: [address] [place] [id number]   Infection Prevention Recommendations for Individuals Confirmed to have, or Being Evaluated for, 2019 Novel Coronavirus (COVID-19) Infection Who Receive Care at Home  Individuals who are confirmed to have, or are being evaluated for, COVID-19 should follow the prevention steps below until a healthcare provider or local or state health department says they can return to normal activities.  Stay home except to get medical care You should restrict activities outside your home, except for getting medical care. Do not go to work, school, or public areas, and do not use public transportation or taxis.  Call ahead before visiting your doctor Before your medical appointment, call the healthcare provider and tell them that you have, or are being evaluated for, COVID-19 infection. This will help the healthcare providers office take steps to keep other people from getting infected. Ask your healthcare provider to call the local or state health department.  Monitor your symptoms Seek prompt medical attention if your illness is worsening (e.g., difficulty breathing). Before going to your medical appointment, call the healthcare provider and tell them that you have, or are being evaluated for, COVID-19 infection. Ask your healthcare provider to call the local or state health department.  Wear a facemask You should wear a facemask that covers your nose and mouth when you are in the same room with other people and when you visit a healthcare provider. People who live with or visit you should also wear a facemask while they are in the same room with you.  Separate yourself from other people in your home As much as possible, you should stay in a different room from other people in your home. Also, you should use a separate bathroom, if available.  Avoid sharing household items You should  not share dishes, drinking glasses, cups, eating utensils, towels, bedding, or other items with other people in your home. After using these items, you should wash them thoroughly with soap and water.  Cover your coughs and sneezes Cover your mouth and nose with a tissue when you cough or sneeze, or you can cough or sneeze into your sleeve. Throw used tissues in a lined trash can, and immediately wash your hands with soap and water for at least 20 seconds or use an alcohol-based hand rub.  Wash your [employer]hands Wash your hands often and thoroughly with soap and water for at least 20 seconds. You can use an alcohol-based hand sanitizer if soap and water are not available and if your hands are not visibly dirty. Avoid touching your eyes, nose, and mouth with unwashed hands.   Prevention Steps for Caregivers and Household Members of Individuals Confirmed to have, or Being Evaluated for, COVID-19 Infection Being Cared for in the Home  If you live with, or provide care at home for, a person confirmed to have, or being evaluated for, COVID-19 infection please follow these guidelines to prevent infection:  Follow healthcare providers instructions Make sure that you understand and can help the patient follow any healthcare provider instructions for all care.  Provide for the patients basic needs You should help the patient with basic needs in the home and provide support for getting groceries, prescriptions, and other personal needs.  Monitor the patients symptoms If they are getting sicker, call his or her medical provider and tell them that the patient has, or is being evaluated for, COVID-19 infection. This will help the healthcare providers  office take steps to keep other people from getting infected. Ask the healthcare provider to call the local or state health department.  Limit the number of people who have contact with the patient  If possible, have only one caregiver for the  patient.  Other household members should stay in another home or place of residence. If this is not possible, they should stay  in another room, or be separated from the patient as much as possible. Use a separate bathroom, if available.  Restrict visitors who do not have an essential need to be in the home.  Keep older adults, very young children, and other sick people away from the patient Keep older adults, very young children, and those who have compromised immune systems or chronic health conditions away from the patient. This includes people with chronic heart, lung, or kidney conditions, diabetes, and cancer.  Ensure good ventilation Make sure that shared spaces in the home have good air flow, such as from an air conditioner or an opened window, weather permitting.  Wash your hands often  Wash your hands often and thoroughly with soap and water for at least 20 seconds. You can use an alcohol based hand sanitizer if soap and water are not available and if your hands are not visibly dirty.  Avoid touching your eyes, nose, and mouth with unwashed hands.  Use disposable paper towels to dry your hands. If not available, use dedicated cloth towels and replace them when they become wet.  Wear a facemask and gloves  Wear a disposable facemask at all times in the room and gloves when you touch or have contact with the patients blood, body fluids, and/or secretions or excretions, such as sweat, saliva, sputum, nasal mucus, vomit, urine, or feces.  Ensure the mask fits over your nose and mouth tightly, and do not touch it during use.  Throw out disposable facemasks and gloves after using them. Do not reuse.  Wash your hands immediately after removing your facemask and gloves.  If your personal clothing becomes contaminated, carefully remove clothing and launder. Wash your hands after handling contaminated clothing.  Place all used disposable facemasks, gloves, and other waste in a lined  container before disposing them with other household waste.  Remove gloves and wash your hands immediately after handling these items.  Do not share dishes, glasses, or other household items with the patient  Avoid sharing household items. You should not share dishes, drinking glasses, cups, eating utensils, towels, bedding, or other items with a patient who is confirmed to have, or being evaluated for, COVID-19 infection.  After the person uses these items, you should wash them thoroughly with soap and water.  Wash laundry thoroughly  Immediately remove and wash clothes or bedding that have blood, body fluids, and/or secretions or excretions, such as sweat, saliva, sputum, nasal mucus, vomit, urine, or feces, on them.  Wear gloves when handling laundry from the patient.  Read and follow directions on labels of laundry or clothing items and detergent. In general, wash and dry with the warmest temperatures recommended on the label.  Clean all areas the individual has used often  Clean all touchable surfaces, such as counters, tabletops, doorknobs, bathroom fixtures, toilets, phones, keyboards, tablets, and bedside tables, every day. Also, clean any surfaces that may have blood, body fluids, and/or secretions or excretions on them.  Wear gloves when cleaning surfaces the patient has come in contact with.  Use a diluted bleach solution (e.g., dilute bleach with 1  part bleach and 10 parts water) or a household disinfectant with a label that says EPA-registered for coronaviruses. To make a bleach solution at home, add 1 tablespoon of bleach to 1 quart (4 cups) of water. For a larger supply, add  cup of bleach to 1 gallon (16 cups) of water.  Read labels of cleaning products and follow recommendations provided on product labels. Labels contain instructions for safe and effective use of the cleaning product including precautions you should take when applying the product, such as wearing gloves or  eye protection and making sure you have good ventilation during use of the product.  Remove gloves and wash hands immediately after cleaning.  Monitor yourself for signs and symptoms of illness Caregivers and household members are considered close contacts, should monitor their health, and will be asked to limit movement outside of the home to the extent possible. Follow the monitoring steps for close contacts listed on the symptom monitoring form.   ? If you have additional questions, contact your local health department or call the epidemiologist on call at [phone] (available 24/7). ? This guidance is subject to change. For the most up-to-date guidance from [facility], please refer to their website: TripMetro.hu      Person Under Monitoring Name: Jon Yates [id number]  Location: [address] [place] [address]   CORONAVIRUS DISEASE 2019 (COVID-19) Guidance for Persons Under Investigation You are being tested for the virus that causes coronavirus disease 2019 (COVID-19). Public health actions are necessary to ensure protection of your health and the health of others, and to prevent further spread of infection. COVID-19 is caused by a virus that can cause symptoms, such as fever, cough, and shortness of breath. The primary transmission from person to person is by coughing or sneezing. On December 23, 2018, the World Health Organization announced a [employer] Emergency of International Concern and on December 24, 2018 the U.S. Department of Health and Human Services declared a public health emergency. If the virus that causesCOVID-19 spreads in the community, it could have severe public health consequences.  As a person under investigation for COVID-19, the [employer] of Health and [employer], Division of [employer] advises you to adhere to the following guidance until your test results are  reported to you. If your test result is positive, you will receive additional information from your provider and your local health department at that time.   Remain at home until you are cleared by your health provider or public health authorities.   Keep a log of visitors to your home using the form provided. Any visitors to your home must be aware of your isolation status.  If you plan to move to a new address or leave the county, notify the local health department in your county.  Call a doctor or seek care if you have an urgent medical need. Before seeking medical care, call ahead and get instructions from the provider before arriving at the medical office, clinic or hospital. Notify them that you are being tested for the virus that causes COVID-19 so arrangements can be made, as necessary, to prevent transmission to others in the healthcare setting. Next, notify the local health department in your county.  If a medical emergency arises and you need to call 911, inform the first responders that you are being tested for the virus that causes COVID-19. Next, notify the local health department in your county.  Adhere to all guidance set forth by the [place] for [place] of patients that is based on guidance from the Center for Disease Control and Prevention with suspected or confirmed COVID-19. It is provided with this guidance for Persons Under Investigation.  Your health and the health of our community are our top priorities. Public Health officials remain available to provide assistance and counseling to you about COVID-19 and compliance with this guidance.  Provider: ____________________________________________________________ Date: ______/_____/_________  By signing below, you acknowledge that you have read and agree to comply with this Guidance for Persons Under Investigation. ______________________________________________________________ Date:  ______/_____/_________  WHO DO I CALL? You can find a list of local health departments here: [email or web address] Health Department: ____________________________________________________________________ Contact Name: ________________________________________________________________________ Telephone: ___________________________________________________________________________  Marice Potter, [place], Communicable Disease Branch COVID-19 Guidance for Persons Under Investigation January 29, 2019

## 2019-03-19 LAB — CULTURE, BLOOD (ROUTINE X 2)
Culture: NO GROWTH
Special Requests: ADEQUATE

## 2019-03-22 ENCOUNTER — Telehealth (INDEPENDENT_AMBULATORY_CARE_PROVIDER_SITE_OTHER): Payer: Self-pay | Admitting: General Practice

## 2019-03-22 ENCOUNTER — Encounter (INDEPENDENT_AMBULATORY_CARE_PROVIDER_SITE_OTHER): Payer: Self-pay

## 2019-03-22 NOTE — Telephone Encounter (Signed)
Called pt and using interpreter was able to email MyChart setup link, get pt signed up, logged in, and questionnaire completed. Pt understands to complete daily.

## 2019-03-23 ENCOUNTER — Encounter (INDEPENDENT_AMBULATORY_CARE_PROVIDER_SITE_OTHER): Payer: Self-pay

## 2019-03-24 ENCOUNTER — Encounter (INDEPENDENT_AMBULATORY_CARE_PROVIDER_SITE_OTHER): Payer: Self-pay

## 2019-03-25 ENCOUNTER — Encounter (INDEPENDENT_AMBULATORY_CARE_PROVIDER_SITE_OTHER): Payer: Self-pay

## 2019-03-26 ENCOUNTER — Encounter (INDEPENDENT_AMBULATORY_CARE_PROVIDER_SITE_OTHER): Payer: Self-pay

## 2019-03-27 ENCOUNTER — Encounter (INDEPENDENT_AMBULATORY_CARE_PROVIDER_SITE_OTHER): Payer: Self-pay

## 2019-03-28 ENCOUNTER — Encounter (INDEPENDENT_AMBULATORY_CARE_PROVIDER_SITE_OTHER): Payer: Self-pay

## 2019-03-29 ENCOUNTER — Encounter (INDEPENDENT_AMBULATORY_CARE_PROVIDER_SITE_OTHER): Payer: Self-pay

## 2019-03-30 ENCOUNTER — Encounter (INDEPENDENT_AMBULATORY_CARE_PROVIDER_SITE_OTHER): Payer: Self-pay

## 2019-03-31 ENCOUNTER — Encounter (INDEPENDENT_AMBULATORY_CARE_PROVIDER_SITE_OTHER): Payer: Self-pay

## 2019-04-04 ENCOUNTER — Ambulatory Visit: Payer: Self-pay | Admitting: Family Medicine

## 2019-04-14 ENCOUNTER — Telehealth: Payer: Self-pay

## 2019-04-14 NOTE — Telephone Encounter (Signed)
Called, no answer. Left message for patient to call back regarding his appointment in our office tomorrow 04/15/2019. HE needs to be TELE visit due to positive COVID-19. Thanks!

## 2019-04-15 ENCOUNTER — Ambulatory Visit (INDEPENDENT_AMBULATORY_CARE_PROVIDER_SITE_OTHER): Payer: Self-pay | Admitting: Family Medicine

## 2019-04-15 ENCOUNTER — Telehealth: Payer: Self-pay

## 2019-04-15 ENCOUNTER — Encounter: Payer: Self-pay | Admitting: Family Medicine

## 2019-04-15 ENCOUNTER — Other Ambulatory Visit: Payer: Self-pay

## 2019-04-15 VITALS — BP 121/83 | HR 93 | Temp 98.6°F

## 2019-04-15 DIAGNOSIS — U071 COVID-19: Secondary | ICD-10-CM

## 2019-04-15 DIAGNOSIS — J9621 Acute and chronic respiratory failure with hypoxia: Secondary | ICD-10-CM

## 2019-04-15 DIAGNOSIS — Z789 Other specified health status: Secondary | ICD-10-CM

## 2019-04-15 NOTE — Patient Instructions (Addendum)
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   [place] [facility] [place] [facility] of Health and [employer], Division of [employer]  call [phone].   Health Maintenance, Male A healthy lifestyle and preventive care is important for your health and wellness. Ask your health care provider about what schedule of regular examinations is right for you. What should I know about weight and diet? Eat a Healthy Diet  Eat plenty of vegetables, fruits, whole grains, low-fat dairy products, and lean protein.  Do not eat a lot of foods high in solid fats, added sugars, or salt.  Maintain a Healthy Weight Regular exercise can help you achieve or maintain a healthy weight. You should:  Do at least 150 minutes of exercise each week. The exercise should increase your heart rate and make you sweat (moderate-intensity exercise).  Do strength-training exercises at least twice a week. Watch Your Levels of Cholesterol and Blood Lipids  Have your blood tested for lipids and cholesterol every 5 years starting at 23 years of age. If you are at high risk for heart disease, you should start having your blood tested when you are 23 years old. You may need to have your cholesterol levels checked more often if: ? Your lipid or cholesterol levels are high. ? You are older than 23 years of age. ? You are at high risk for heart disease. What should I know about cancer screening? Many types of cancers can be detected early and may often be prevented. Lung Cancer  You should be screened every year for lung cancer if: ? You are a current smoker who has smoked for at least 30 years. ? You are a former smoker who has quit within the past 15 years.  Talk to your health care provider about your screening options, when you should start screening, and how often you should be screened. Colorectal Cancer  Routine colorectal cancer screening usually begins at 23 years of age and should  be repeated every 5-10 years until you are 23 years old. You may need to be screened more often if early forms of precancerous polyps or small growths are found. Your health care provider may recommend screening at an earlier age if you have risk factors for colon cancer.  Your health care provider may recommend using home test kits to check for hidden blood in the stool.  A small camera at the end of a tube can be used to examine your colon (sigmoidoscopy or colonoscopy). This checks for the earliest forms of colorectal cancer. Prostate and Testicular Cancer  Depending on your age and overall health, your health care provider may do certain tests to screen for prostate and testicular cancer.  Talk to your health care provider about any symptoms or concerns you have about testicular or prostate cancer. Skin Cancer  Check your skin from head to toe regularly.  Tell your health care provider about any new moles or changes in moles, especially if: ? There is a change in a mole's size, shape, or color. ? You have a mole that is larger than a pencil eraser.  Always use sunscreen. Apply sunscreen liberally and repeat throughout the day.  Protect yourself by wearing long sleeves, pants, a wide-brimmed hat, and sunglasses when outside. What should I know about heart disease, diabetes, and high blood pressure?  If you are 9218-23 years of age, have your blood pressure checked every 3-5 years. If you are 23 years of age or older, have  your blood pressure checked every year. You should have your blood pressure measured twice-once when you are at a hospital or clinic, and once when you are not at a hospital or clinic. Record the average of the two measurements. To check your blood pressure when you are not at a hospital or clinic, you can use: ? An automated blood pressure machine at a pharmacy. ? A home blood pressure monitor.  Talk to your health care provider about your target blood pressure.  If  you are between 52-73 years old, ask your health care provider if you should take aspirin to prevent heart disease.  Have regular diabetes screenings by checking your fasting blood sugar level. ? If you are at a normal weight and have a low risk for diabetes, have this test once every three years after the age of 54. ? If you are overweight and have a high risk for diabetes, consider being tested at a younger age or more often.  A one-time screening for abdominal aortic aneurysm (AAA) by ultrasound is recommended for men aged 65-75 years who are current or former smokers. What should I know about preventing infection? Hepatitis B If you have a higher risk for hepatitis B, you should be screened for this virus. Talk with your health care provider to find out if you are at risk for hepatitis B infection. Hepatitis C Blood testing is recommended for:  Everyone born from [birth year] through 1965.  Anyone with known risk factors for hepatitis C. Sexually Transmitted Diseases (STDs)  You should be screened each year for STDs including gonorrhea and chlamydia if: ? You are sexually active and are younger than 23 years of age. ? You are older than 23 years of age and your health care provider tells you that you are at risk for this type of infection. ? Your sexual activity has changed since you were last screened and you are at an increased risk for chlamydia or gonorrhea. Ask your health care provider if you are at risk.  Talk with your health care provider about whether you are at high risk of being infected with HIV. Your health care provider may recommend a prescription medicine to help prevent HIV infection. What else can I do?  Schedule regular health, dental, and eye exams.  Stay current with your vaccines (immunizations).  Do not use any tobacco products, such as cigarettes, chewing tobacco, and e-cigarettes. If you need help quitting, ask your health care provider.  Limit alcohol intake to  no more than 2 drinks per day. One drink equals 12 ounces of beer, 5 ounces of wine, or 1 ounces of hard liquor.  Do not use street drugs.  Do not share needles.  Ask your health care provider for help if you need support or information about quitting drugs.  Tell your health care provider if you often feel depressed.  Tell your health care provider if you have ever been abused or do not feel safe at home. This information is not intended to replace advice given to you by your health care provider. Make sure you discuss any questions you have with your health care provider. Document Released: 05/08/2008 Document Revised: 07/09/2016 Document Reviewed: 08/14/2015 Elsevier Interactive Patient Education  2019 [employer].

## 2019-04-15 NOTE — Telephone Encounter (Signed)
Rec'd call from Nurse Practitioner at [facility].  Reported that pt. Had been hospitalized in April and tested positive for COVID 19.  The pt. Is in her office, and requesting to be retested to be sure he no longer has COVID 19.  Reported the pt. Is asymptomatic, but needs a retest to give to employer, so he can return to work.  Advised of protocol for pt's that are symptomatic, to self quarantine for at least 7 days from onset of symptoms, and for at least 3 days after absence of fever, (without using antipyretic medication), and also seeing improvement in respiratory symptoms.   Advised that the [facility] Community Sites are not retesting pts.  Discussed poss. option for pt. to be cleared to return to work from clinical perspective, with note from Practitioner.  NP stated the pt. Really wants to be retested, to be able to show employer, that he is no longer infected.  Advised will return call with possible option for getting tested.  Returned call to Mike Gip, NP @ [phone].  Left vm that pt. Could go to the [employer]. [employer] located at [employer], at the [facility]. Parking Deck.  Advised in voice message that the pt. Would need to call [phone] to receive prior authorization from the testing site, before going to their location.  Advised to call back to [facility], Triage nurse @ [phone], if further questions.

## 2019-04-15 NOTE — Progress Notes (Signed)
[facility]  New Patient Encounter Provider: Mike Gipndre Jaelyne Deeg, FNP    WUJ:[id number]  NFA:[id number]:[id number]  DOB - 16-Mar-1996  SUBJECTIVE:   Jon Yates, is a 23 y.o. male who presents to establish care with this clinic.   Current problems/concerns:  Patient admitted to the hospital due to fever, myalgias, shortness of breath and found to be positive for COVID 19. Patient hospitalized from 4/19/-03/18/2019. Presents today for a follow up visit. He states that he has been symptom free for the past week and has noted an improvement in his respiratory efforts. He states that he would like to be re-tested arrangements and employer. He has been quarantined since 03/18/2019.   Marland Kitchen. No Known Allergies No past medical history on file. Current Outpatient Medications on File Prior to Visit  Medication Sig Dispense Refill  . acetaminophen (TYLENOL) 325 MG tablet Take 2 tablets (650 mg total) by mouth every 6 (six) hours as needed. Do not take more than 4000mg  of tylenol per day 30 tablet 0  . albuterol (VENTOLIN HFA) 108 (90 Base) MCG/ACT inhaler Inhale 2 puffs into the lungs every 4 (four) hours as needed for wheezing or shortness of breath. 6.7 g 0  . benzonatate (TESSALON) 200 MG capsule Take 1 capsule (200 mg total) by mouth 3 (three) times daily as needed for cough. 20 capsule 0  . ibuprofen (ADVIL) 600 MG tablet Take 1 tablet (600 mg total) by mouth every 6 (six) hours as needed. 30 tablet 0   No current facility-administered medications on file prior to visit.    No family history on file. Social History   Socioeconomic History  . Marital status: Single    Spouse name: Not on file  . Number of children: Not on file  . Years of education: Not on file  . Highest education level: Not on file  Occupational History  . Not on file  Social Needs  . Financial resource strain: Not on file  . Food insecurity:    Worry: Not on file    Inability: Not on  file  . Transportation needs:    Medical: Not on file    Non-medical: Not on file  Tobacco Use  . Smoking status: Never Smoker  . Smokeless tobacco: Never Used  Substance and Sexual Activity  . Alcohol use: Yes    Comment: weekends-12 beers  . Drug use: Never  . Sexual activity: Yes  Lifestyle  . Physical activity:    Days per week: Not on file    Minutes per session: Not on file  . Stress: Not on file  Relationships  . Social connections:    Talks on phone: Not on file    Gets together: Not on file    Attends religious service: Not on file    Active member of club or organization: Not on file    Attends meetings of clubs or organizations: Not on file    Relationship status: Not on file  . Intimate partner violence:    Fear of current or ex partner: Not on file    Emotionally abused: Not on file    Physically abused: Not on file    Forced sexual activity: Not on file  Other Topics Concern  . Not on file  Social History Narrative  . Not on file    Review of Systems  Constitutional: Negative.   HENT: Negative.   Eyes: Negative.   Respiratory: Negative.   Cardiovascular: Negative.  Gastrointestinal: Negative.   Genitourinary: Negative.   Musculoskeletal: Negative.   Skin: Negative.   Neurological: Negative.   Psychiatric/Behavioral: Negative.      OBJECTIVE:    BP 121/83 (BP Location: Left Arm, Patient Position: Sitting, Cuff Size: Normal)   Pulse 93   Temp 98.6 F (37 C) (Oral)   SpO2 100%     Physical Exam  Constitutional: He is oriented to person, place, and time and well-developed, well-nourished, and in no distress. No distress.  HENT:  Head: Normocephalic and atraumatic.  Eyes: Pupils are equal, round, and reactive to light. Conjunctivae and EOM are normal.  Neck: Normal range of motion. Neck supple.  Cardiovascular: Normal rate, regular rhythm and intact distal pulses. Exam reveals no gallop and no friction rub.  No murmur heard.  Pulmonary/Chest: Effort normal and breath sounds normal. No respiratory distress. He has no wheezes.  Abdominal: Soft. Bowel sounds are normal. There is no abdominal tenderness.  Musculoskeletal: Normal range of motion.        General: No tenderness or edema.  Lymphadenopathy:    He has no cervical adenopathy.  Neurological: He is alert and oriented to person, place, and time. Gait normal.  Skin: Skin is warm and dry.  Psychiatric: Mood, memory, affect and judgment normal.  Nursing note and vitals reviewed.    ASSESSMENT/PLAN:  1. COVID-19 virus infection Patient with resolution of symptoms. He is requesting testing. [facility] and spoke with Jon Blinks, RN. Advised of protocol for pt's that are symptomatic, to self quarantine for at least 7 days from onset of symptoms, and for at least 3 days after absence of fever, (without using antipyretic medication), and also seeing improvement in respiratory symptoms.   Advised that the [facility] Community Sites are not retesting pts. Gave patient a note stating that he can return to work.   - CBC with Differential - Comprehensive metabolic panel  2. Acute on chronic respiratory failure with hypoxia (HCC) Resolved.   3. Language barrier to communication Interpreter services utilized throughout the encounter.     Return in about 1 year (around 04/14/2020), or if symptoms worsen or fail to improve, for physical.  The patient was given clear instructions to go to ER or return to medical center if symptoms don't improve, worsen or new problems develop. The patient verbalized understanding. The patient was told to call to get lab results if they haven't heard anything in the next week.     This note has been created with Education officer, environmental. Any transcriptional errors are unintentional.   Ms. Jon L. Riley Lam, FNP-BC [facility] [address] [place], [place] [address] [phone]

## 2019-04-16 LAB — COMPREHENSIVE METABOLIC PANEL
ALT: 89 IU/L — ABNORMAL HIGH (ref 0–44)
AST: 51 IU/L — ABNORMAL HIGH (ref 0–40)
Albumin/Globulin Ratio: 1.8 (ref 1.2–2.2)
Albumin: 4.7 g/dL (ref 4.1–5.2)
Alkaline Phosphatase: 72 IU/L (ref 39–117)
BUN/Creatinine Ratio: 12 (ref 9–20)
BUN: 9 mg/dL (ref 6–20)
Bilirubin Total: 0.4 mg/dL (ref 0.0–1.2)
CO2: 22 mmol/L (ref 20–29)
Calcium: 9.2 mg/dL (ref 8.7–10.2)
Chloride: 103 mmol/L (ref 96–106)
Creatinine, Ser: 0.74 mg/dL — ABNORMAL LOW (ref 0.76–1.27)
GFR calc Af Amer: 151 mL/min/{1.73_m2} (ref >59)
GFR calc non Af Amer: 131 mL/min/{1.73_m2} (ref >59)
Globulin, Total: 2.6 g/dL (ref 1.5–4.5)
Glucose: 106 mg/dL — ABNORMAL HIGH (ref 65–99)
Potassium: 4.5 mmol/L (ref 3.5–5.2)
Sodium: 138 mmol/L (ref 134–144)
Total Protein: 7.3 g/dL (ref 6.0–8.5)

## 2019-04-16 LAB — CBC WITH DIFFERENTIAL/PLATELET
Basophils Absolute: 0 10*3/uL (ref 0.0–0.2)
Basos: 1 %
EOS (ABSOLUTE): 0.1 10*3/uL (ref 0.0–0.4)
Eos: 2 %
Hematocrit: 41.7 % (ref 37.5–51.0)
Hemoglobin: 14.2 g/dL (ref 13.0–17.7)
Immature Grans (Abs): 0 10*3/uL (ref 0.0–0.1)
Immature Granulocytes: 0 %
Lymphocytes Absolute: 2.4 10*3/uL (ref 0.7–3.1)
Lymphs: 49 %
MCH: 29.4 pg (ref 26.6–33.0)
MCHC: 34.1 g/dL (ref 31.5–35.7)
MCV: 86 fL (ref 79–97)
Monocytes Absolute: 0.6 10*3/uL (ref 0.1–0.9)
Monocytes: 12 %
Neutrophils Absolute: 1.8 10*3/uL (ref 1.4–7.0)
Neutrophils: 36 %
Platelets: 240 10*3/uL (ref 150–450)
RBC: 4.83 x10E6/uL (ref 4.14–5.80)
RDW: 13.8 % (ref 11.6–15.4)
WBC: 5 10*3/uL (ref 3.4–10.8)

## 2019-04-21 ENCOUNTER — Telehealth: Payer: Self-pay | Admitting: *Deleted

## 2019-04-21 NOTE — Telephone Encounter (Signed)
I called pt and left a voicemail regarding donating plasma and to please call [phone].

## 2019-04-25 ENCOUNTER — Encounter: Payer: Self-pay | Admitting: Family Medicine

## 2019-05-25 ENCOUNTER — Other Ambulatory Visit: Payer: Self-pay

## 2019-05-25 ENCOUNTER — Other Ambulatory Visit: Payer: Self-pay | Admitting: Internal Medicine

## 2019-05-25 ENCOUNTER — Ambulatory Visit
Admission: RE | Admit: 2019-05-25 | Discharge: 2019-05-25 | Disposition: A | Discharge status: Home / Self Care | Payer: No Typology Code available for payment source | Source: Ambulatory Visit | Attending: Internal Medicine | Admitting: Internal Medicine

## 2019-05-25 DIAGNOSIS — Z111 Encounter for screening for respiratory tuberculosis: Secondary | ICD-10-CM

## 2019-08-03 ENCOUNTER — Encounter (HOSPITAL_COMMUNITY): Payer: Self-pay | Admitting: *Deleted

## 2019-08-03 ENCOUNTER — Encounter (HOSPITAL_COMMUNITY): Payer: Self-pay

## 2019-10-26 ENCOUNTER — Encounter: Payer: Self-pay | Admitting: Family Medicine

## 2019-12-15 ENCOUNTER — Ambulatory Visit (INDEPENDENT_AMBULATORY_CARE_PROVIDER_SITE_OTHER): Payer: 59 | Admitting: Nurse Practitioner

## 2019-12-15 ENCOUNTER — Other Ambulatory Visit: Payer: Self-pay

## 2019-12-15 ENCOUNTER — Encounter: Payer: Self-pay | Admitting: Nurse Practitioner

## 2019-12-15 VITALS — BP 141/87 | HR 67 | Temp 97.8°F | Resp 16 | Ht 70.0 in | Wt 235.0 lb

## 2019-12-15 DIAGNOSIS — I8393 Asymptomatic varicose veins of bilateral lower extremities: Secondary | ICD-10-CM | POA: Diagnosis not present

## 2019-12-15 DIAGNOSIS — R030 Elevated blood-pressure reading, without diagnosis of hypertension: Secondary | ICD-10-CM

## 2019-12-15 NOTE — Patient Instructions (Addendum)
Preventing Hypertension Hypertension, commonly called high blood pressure, is when the force of blood pumping through the arteries is too strong. Arteries are blood vessels that carry blood from the heart throughout the body. Over time, hypertension can damage the arteries and decrease blood flow to important parts of the body, including the brain, heart, and kidneys. Often, hypertension does not cause symptoms until blood pressure is very high. For this reason, it is important to have your blood pressure checked on a regular basis. Hypertension can often be prevented with diet and lifestyle changes. If you already have hypertension, you can control it with diet and lifestyle changes, as well as medicine. What nutrition changes can be made? Maintain a healthy diet. This includes: Eating less salt (sodium). Ask your health care provider how much sodium is safe for you to have. The general recommendation is to consume less than 1 tsp (2,300 mg) of sodium a day. Varicose Veins Varicose veins are veins that have become enlarged, bulged, and twisted. They most often appear in the legs. What are the causes? This condition is caused by damage to the valves in the vein. These valves help blood return to your heart. When they are damaged and they stop working properly, blood may flow backward and back up in the veins near the skin, causing the veins to get larger and appear twisted. The condition can result from any issue that causes blood to back up, like pregnancy, prolonged standing, or obesity. What increases the risk? This condition is more likely to develop in people who are:  On their feet a lot.  Pregnant.  Overweight. What are the signs or symptoms? Symptoms of this condition include:  Bulging, twisted, and bluish veins.  A feeling of heaviness. This may be worse at the end of the day.  Leg pain. This may be worse at the end of the day.  Swelling in the leg.  Changes in skin color over  the veins. How is this diagnosed? This condition may be diagnosed based on your symptoms, a physical exam, and an ultrasound test. How is this treated? Treatment for this condition may involve:  Avoiding sitting or standing in one position for long periods of time.  Wearing compression stockings. These stockings help to prevent blood clots and reduce swelling in the legs.  Raising (elevating) the legs when resting.  Losing weight.  Exercising regularly. If you have persistent symptoms or want to improve the way your varicose veins look, you may choose to have a procedure to close the varicose veins off or to remove them. Treatments to close off the veins include:  Sclerotherapy. In this treatment, a solution is injected into a vein to close it off.  Laser treatment. In this treatment, the vein is heated with a laser to close it off.  Radiofrequency vein ablation. In this treatment, an electrical current produced by radio waves is used to close off the vein. Treatments to remove the veins include:  Phlebectomy. In this treatment, the veins are removed through small incisions made over the veins.  Vein ligation and stripping. In this treatment, incisions are made over the veins. The veins are then removed after being tied (ligated) with stitches (sutures). Follow these instructions at home: Activity  Walk as much as possible. Walking increases blood flow. This helps blood return to the heart and takes pressure off your veins. It also increases your cardiovascular strength.  Follow your health care provider's instructions about exercising.  Do not stand or  sit in one position for a long period of time.  Do not sit with your legs crossed.  Rest with your legs raised during the day. General instructions   Follow any diet instructions given to you by your health care provider.  Wear compression stockings as directed by your health care provider. Do not wear other kinds of tight  clothing around your legs, pelvis, or waist.  Elevate your legs at night to above the level of your heart.  If you get a cut in the skin over the varicose vein and the vein bleeds: ? Lie down with your leg raised. ? Apply firm pressure to the cut with a clean cloth until the bleeding stops. ? Place a bandage (dressing) on the cut. Contact a health care provider if:  The skin around your varicose veins starts to break down.  You have pain, redness, tenderness, or hard swelling over a vein.  You are uncomfortable because of pain.  You get a cut in the skin over a varicose vein and it will not stop bleeding. Summary  Varicose veins are veins that have become enlarged, bulged, and twisted. They most often appear in the legs.  This condition is caused by damage to the valves in the vein. These valves help blood return to your heart.  Treatment for this condition includes frequent movements, wearing compression stockings, losing weight, and exercising regularly. In some cases, procedures are done to close off or remove the veins.  Treatment for this condition may include wearing compression stockings, elevating the legs, losing weight, and engaging in regular activity. In some cases, procedures are done to close off or remove the veins. This information is not intended to replace advice given to you by your health care provider. Make sure you discuss any questions you have with your health care provider. Document Revised: 01/06/2019 Document Reviewed: 12/03/2016 Elsevier Patient Education  [employer].   ? Do not add salt to your food. ? Choose low-sodium options when grocery shopping and eating out.  Limiting fats in your diet. You can do this by eating low-fat or fat-free dairy products and by eating less red meat.  Eating more fruits, vegetables, and whole grains. Make a goal to eat: ? 1-2 cups of fresh fruits and vegetables each day. ? 3-4 servings of whole grains each  day.  Avoiding foods and beverages that have added sugars.  Eating fish that contain healthy fats (omega-3 fatty acids), such as mackerel or salmon. If you need help putting together a healthy eating plan, try the DASH diet. This diet is high in fruits, vegetables, and whole grains. It is low in sodium, red meat, and added sugars. DASH stands for Dietary Approaches to Stop Hypertension. What lifestyle changes can be made?   Lose weight if you are overweight. Losing just 3?5% of your body weight can help prevent or control hypertension. ? For example, if your present weight is 200 lb (91 kg), a loss of 3-5% of your weight means losing 6-10 lb (2.7-4.5 kg). ? Ask your health care provider to help you with a diet and exercise plan to safely lose weight.  Get enough exercise. Do at least 150 minutes of moderate-intensity exercise each week. ? You could do this in short exercise sessions several times a day, or you could do longer exercise sessions a few times a week. For example, you could take a brisk 10-minute walk or bike ride, 3 times a day, for 5 days a week.  Find ways to reduce stress, such as exercising, meditating, listening to music, or taking a yoga class. If you need help reducing stress, ask your health care provider.  Do not smoke. This includes e-cigarettes. Chemicals in tobacco and nicotine products raise your blood pressure each time you smoke. If you need help quitting, ask your health care provider.  Avoid alcohol. If you drink alcohol, limit alcohol intake to no more than 1 drink a day for nonpregnant women and 2 drinks a day for men. One drink equals 12 oz of beer, 5 oz of wine, or 1 oz of hard liquor. Why are these changes important? Diet and lifestyle changes can help you prevent hypertension, and they may make you feel better overall and improve your quality of life. If you have hypertension, making these changes will help you control it and help prevent major  complications, such as:  Hardening and narrowing of arteries that supply blood to: ? Your heart. This can cause a heart attack. ? Your brain. This can cause a stroke. ? Your kidneys. This can cause kidney failure.  Stress on your heart muscle, which can cause heart failure. What can I do to lower my risk?  Work with your health care provider to make a hypertension prevention plan that works for you. Follow your plan and keep all follow-up visits as told by your health care provider.  Learn how to check your blood pressure at home. Make sure that you know your personal target blood pressure, as told by your health care provider. How is this treated? In addition to diet and lifestyle changes, your health care provider may recommend medicines to help lower your blood pressure. You may need to try a few different medicines to find what works best for you. You also may need to take more than one medicine. Take over-the-counter and prescription medicines only as told by your health care provider. Where to find support Your health care provider can help you prevent hypertension and help you keep your blood pressure at a healthy level. Your local hospital or your community may also provide support services and prevention programs. The American Heart Association offers an online support network at: CheapBootlegs.com.cy Where to find more information Learn more about hypertension from:  [place], Lung, and Blood Institute: ElectronicHangman.is  Centers for Disease Control and Prevention: https://ingram.com/  American Academy of Family Physicians: http://familydoctor.org/familydoctor/en/diseases-conditions/high-blood-pressure.printerview.all.html Learn more about the DASH diet from:  [place], Lung, and [place]: https://www.reyes.com/ Contact a health care provider if:  You think you  are having a reaction to medicines you have taken.  You have recurrent headaches or feel dizzy.  You have swelling in your ankles.  You have trouble with your vision. Summary  Hypertension often does not cause any symptoms until blood pressure is very high. It is important to get your blood pressure checked regularly.  Diet and lifestyle changes are the most important steps in preventing hypertension.  By keeping your blood pressure in a healthy range, you can prevent complications like heart attack, heart failure, stroke, and kidney failure.  Work with your health care provider to make a hypertension prevention plan that works for you. This information is not intended to replace advice given to you by your health care provider. Make sure you discuss any questions you have with your health care provider. Document Revised: 03/04/2019 Document Reviewed: 07/21/2016 Elsevier Patient Education  2020 [employer].

## 2019-12-15 NOTE — Progress Notes (Signed)
Established Patient Office Visit  Subjective:  Patient ID: Jon Yates, male    DOB: 01-21-1996  Age: 24 y.o. MRN: [id number]  CC:  Chief Complaint  Patient presents with  . Arm Pain    right arm pain   . Leg Problem    says pain in veins in legs   . Abdominal Pain    rib area     HPI Jon Yates presents for generalized pain. He has problem with leg veins getting bigger. He work as 8 hours standing.  He denies any previous accidents or injury.  He feels like this is something that he stepped with as a child.  He admits that now he is here in [place] and would like to have this fixed.  He denies any pain in his legs.  He denies family history of varicose veins or heart disease.  He is complaining of left-sided back and rib pain.  He noticed this about 1 month ago when his working partner was out and he had to do more work.  The pain only comes with movement of his left arm.  He has tried a topical cream which did seem to help.  He denies any radiating pain.  History reviewed. No pertinent past medical history.  History reviewed. No pertinent surgical history.  History reviewed. No pertinent family history.  Social History   Socioeconomic History  . Marital status: Single    Spouse name: Not on file  . Number of children: Not on file  . Years of education: Not on file  . Highest education level: Not on file  Occupational History  . Not on file  Tobacco Use  . Smoking status: Never Smoker  . Smokeless tobacco: Never Used  Substance and Sexual Activity  . Alcohol use: Yes    Comment: weekends-12 beers  . Drug use: Never  . Sexual activity: Yes  Other Topics Concern  . Not on file  Social History Narrative  . Not on file   Social Determinants of Health   Financial Resource Strain:   . Difficulty of Paying Living Expenses: Not on file  Food Insecurity:   . Worried About Programme researcher, broadcasting/film/video in the Last Year: Not on file  . Ran Out of Food in the  Last Year: Not on file  Transportation Needs:   . Lack of Transportation (Medical): Not on file  . Lack of Transportation (Non-Medical): Not on file  Physical Activity:   . Days of Exercise per Week: Not on file  . Minutes of Exercise per Session: Not on file  Stress:   . Feeling of Stress : Not on file  Social Connections:   . Frequency of Communication with Friends and Family: Not on file  . Frequency of Social Gatherings with Friends and Family: Not on file  . Attends Religious Services: Not on file  . Active Member of Clubs or Organizations: Not on file  . Attends Banker Meetings: Not on file  . Marital Status: Not on file  Intimate Partner Violence:   . Fear of Current or Ex-Partner: Not on file  . Emotionally Abused: Not on file  . Physically Abused: Not on file  . Sexually Abused: Not on file    Outpatient Medications Prior to Visit  Medication Sig Dispense Refill  . acetaminophen (TYLENOL) 325 MG tablet Take 2 tablets (650 mg total) by mouth every 6 (six) hours as needed. Do not take more than 4000mg  of  tylenol per day 30 tablet 0  . albuterol (VENTOLIN HFA) 108 (90 Base) MCG/ACT inhaler Inhale 2 puffs into the lungs every 4 (four) hours as needed for wheezing or shortness of breath. 6.7 g 0  . benzonatate (TESSALON) 200 MG capsule Take 1 capsule (200 mg total) by mouth 3 (three) times daily as needed for cough. 20 capsule 0  . ibuprofen (ADVIL) 600 MG tablet Take 1 tablet (600 mg total) by mouth every 6 (six) hours as needed. 30 tablet 0   No facility-administered medications prior to visit.    No Known Allergies COVID-19 CDC guideline data points:  The patient presents with [exposure to, suspected, or confirmed] COVID-19 with associated symptoms of [fever, dry cough, SOB, anorexia, or diarrhea], complicated by this/these comorbidities: [none].  Patient educated to notify a healthcare professional if they develop further symptoms that include chest pain,  arrhythmias/palpitations, prolonged SOB or fever, or other concerning symptoms.    ASSESSMENT: [DICTATE THE MOST RELEVANT DIAGNOSIS(ES) listed below:  "COVID Actual Exposure" "COVID Acute Bronchitis" "COVID Acute Respiratory Failure" "COVID ARDS" "COVID Bronchitis NOS" "COVID Lower Respiratory Infection" "COVID Other Respiratory Infection" "COVID Pneumonia" "COVID Possible Exposure Ruled Out" "COVID Sepsis" "COVID Symptoms"] ROS Review of Systems  Constitutional: Negative.   HENT: Negative.   Eyes: Negative.   Respiratory: Negative.   Cardiovascular: Positive for leg swelling.  Gastrointestinal: Negative.   Endocrine: Negative.   Genitourinary: Negative.   Musculoskeletal: Positive for back pain and myalgias.  Skin: Negative.   Allergic/Immunologic: Negative.   Neurological: Negative.   Hematological: Negative.   Psychiatric/Behavioral: Negative.       Objective:    Physical Exam  Constitutional: He is oriented to person, place, and time. He appears well-developed and well-nourished.  HENT:  Head: Normocephalic.  Cardiovascular: Intact distal pulses.  Pulmonary/Chest: Effort normal.  Musculoskeletal:     Cervical back: Neck supple.     Comments: No tenderness with palpation Pain elicited with on-the-job movement of arms  Neurological: He is alert and oriented to person, place, and time.  Skin: Skin is warm and dry.  Enlarged varicose veins greater on the right than left no pitting edema  Psychiatric: He has a normal mood and affect. His behavior is normal. Judgment and thought content normal.    BP (!) 141/87 (BP Location: Right Arm, Patient Position: Sitting, Cuff Size: Large)   Pulse 67   Temp 97.8 F (36.6 C) (Oral)   Resp 16   Ht 5\' 10"  (1.778 m)   Wt 235 lb (106.6 kg)   SpO2 100%   BMI 33.72 kg/m  Wt Readings from Last 3 Encounters:  12/15/19 235 lb (106.6 kg)  03/14/19 228 lb 2.8 oz (103.5 kg)  03/11/19 220 lb (99.8 kg)     Health  Maintenance Due  Topic Date Due  . HIV Screening  07/22/2011    There are no preventive care reminders to display for this patient.  No results found for: TSH Lab Results  Component Value Date   WBC 5.0 04/15/2019   HGB 14.2 04/15/2019   HCT 41.7 04/15/2019   MCV 86 04/15/2019   PLT 240 04/15/2019   Lab Results  Component Value Date   NA 138 04/15/2019   K 4.5 04/15/2019   CO2 22 04/15/2019   GLUCOSE 106 (H) 04/15/2019   BUN 9 04/15/2019   CREATININE 0.74 (L) 04/15/2019   BILITOT 0.4 04/15/2019   ALKPHOS 72 04/15/2019   AST 51 (H) 04/15/2019   ALT 89 (H)  04/15/2019   PROT 7.3 04/15/2019   ALBUMIN 4.7 04/15/2019   CALCIUM 9.2 04/15/2019   ANIONGAP 12 03/17/2019   No results found for: CHOL No results found for: HDL No results found for: [facility] Lab Results  Component Value Date   TRIG 134 03/13/2019   No results found for: CHOLHDL No results found for: MEBR8X    Assessment & Plan:   Problem List Items Addressed This Visit    None    Visit Diagnoses    Asymptomatic varicose veins of both lower extremities    -  Primary   Referral to vascular surgeon for baseline review of varicose veins and to reassure patient   Relevant Orders   Ambulatory referral to Vascular Surgery   Elevated blood pressure reading       Information given on healthy diet and weight loss 41-month follow-up for further evaluation      No orders of the defined types were placed in this encounter.   Follow-up: Return in about 3 months (around 03/14/2020).    Barbette Merino, NP

## 2020-01-24 ENCOUNTER — Other Ambulatory Visit: Payer: Self-pay | Admitting: *Deleted

## 2020-01-24 DIAGNOSIS — I83893 Varicose veins of bilateral lower extremities with other complications: Secondary | ICD-10-CM

## 2020-01-26 NOTE — Progress Notes (Signed)
Reason for referral: Swollen Right > left  leg  History of Present Illness  Jon Yates is a 24 y.o. male who presents with chief complaint: swollen leg.  Patient notes, onset of swelling 6 years months ago, associated with prolonged sitting and standing.  The patient has had no history of DVT, positive history of varicose vein, no history of venous stasis ulcers, no history of  Lymphedema and none history of skin changes in lower legs.  There is no family history of venous disorders.  The patient has not used compression stockings in the past.  He reports heavinees in the right LE> than the left at the end of the day with increased edema and sock marks around his ankles.  .  He noticed the varicose veins getting larger over the last 6 years.    History reviewed. No pertinent past medical history.  History reviewed. No pertinent surgical history.  Social History   Socioeconomic History  . Marital status: Single    Spouse name: Not on file  . Number of children: Not on file  . Years of education: Not on file  . Highest education level: Not on file  Occupational History  . Not on file  Tobacco Use  . Smoking status: Never Smoker  . Smokeless tobacco: Never Used  Substance and Sexual Activity  . Alcohol use: Yes    Comment: weekends-12 beers  . Drug use: Never  . Sexual activity: Yes  Other Topics Concern  . Not on file  Social History Narrative  . Not on file   Social Determinants of Health   Financial Resource Strain:   . Difficulty of Paying Living Expenses: Not on file  Food Insecurity:   . Worried About Programme researcher, broadcasting/film/video in the Last Year: Not on file  . Ran Out of Food in the Last Year: Not on file  Transportation Needs:   . Lack of Transportation (Medical): Not on file  . Lack of Transportation (Non-Medical): Not on file  Physical Activity:   . Days of Exercise per Week: Not on file  . Minutes of Exercise per Session: Not on file  Stress:   .  Feeling of Stress : Not on file  Social Connections:   . Frequency of Communication with Friends and Family: Not on file  . Frequency of Social Gatherings with Friends and Family: Not on file  . Attends Religious Services: Not on file  . Active Member of Clubs or Organizations: Not on file  . Attends Banker Meetings: Not on file  . Marital Status: Not on file  Intimate Partner Violence:   . Fear of Current or Ex-Partner: Not on file  . Emotionally Abused: Not on file  . Physically Abused: Not on file  . Sexually Abused: Not on file    History reviewed. No pertinent family history.  No current outpatient medications on file prior to visit.   No current facility-administered medications on file prior to visit.    Allergies as of 01/27/2020  . (No Known Allergies)     ROS:   General:  No weight loss, Fever, chills  HEENT: No recent headaches, no nasal bleeding, no visual changes, no sore throat  Neurologic: No dizziness, blackouts, seizures. No recent symptoms of stroke or mini- stroke. No recent episodes of slurred speech, or temporary blindness.  Cardiac: No recent episodes of chest pain/pressure, no shortness of breath at rest.  No shortness of  breath with exertion.  Denies history of atrial fibrillation or irregular heartbeat  Vascular: No history of rest pain in feet.  No history of claudication.  No history of non-healing ulcer, No history of DVT   Pulmonary: No home oxygen, no productive cough, no hemoptysis,  No asthma or wheezing  Musculoskeletal:  [ ]  Arthritis, [ ]  Low back pain,  [x ] Joint pain  Hematologic:No history of hypercoagulable state.  No history of easy bleeding.  No history of anemia  Gastrointestinal: No hematochezia or melena,  No gastroesophageal reflux, no trouble swallowing  Urinary: [ ]  chronic Kidney disease, [ ]  on HD - [ ]  MWF or [ ]  TTHS, [ ]  Burning with urination, [ ]  Frequent urination, [ ]  Difficulty urinating;   Skin:  No rashes  Psychological: No history of anxiety,  No history of depression  Physical Examination  Vitals:   01/27/20 1350  BP: (!) 135/91  Pulse: (!) 59  Resp: 16  Temp: 98.1 F (36.7 C)  TempSrc: Temporal  SpO2: 99%  Weight: 224 lb (101.6 kg)  Height: 5\' 7"  (1.702 m)    Body mass index is 35.08 kg/m.  General:  Alert and oriented, no acute distress HEENT: Normal Neck: No bruit or JVD Pulmonary: Clear to auscultation bilaterally Cardiac: Regular Rate and Rhythm without murmur Abdomen: Soft, non-tender, non-distended, no mass, no scars Skin: No rash  Right anterior lower leg large varicosities, left medial calf varicosities.   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 and lower extremity motor 5/5 and symmetric  DATA:   Venous Reflux Times  +--------------+---------+------+-----------+------------+--------+  RIGHT     Reflux NoRefluxReflux TimeDiameter cmsComments               Yes                   +--------------+---------+------+-----------+------------+--------+  CFV            yes  >1 second             +--------------+---------+------+-----------+------------+--------+  GSV at SFJ        yes  >500 ms   0.78        +--------------+---------+------+-----------+------------+--------+  GSV prox thigh      yes  >500 ms   0.68        +--------------+---------+------+-----------+------------+--------+  GSV mid thigh       yes  >500 ms   0.77        +--------------+---------+------+-----------+------------+--------+  GSV dist thigh      yes  >500 ms   0.91         +--------------+---------+------+-----------+------------+--------+  GSV at knee        yes  >500 ms   0.93        +--------------+---------+------+-----------+------------+--------+  GSV prox calf       yes  >500 ms   0.94        +--------------+---------+------+-----------+------------+--------+  SSV Pop Fossa no               0.45        +--------------+---------+------+-----------+------------+--------+     +--------------+---------+------+-----------+------------+--------+  LEFT     Reflux NoRefluxReflux TimeDiameter cmsComments               Yes                   +--------------+---------+------+-----------+------------+--------+  CFV            yes  >1 second             +--------------+---------+------+-----------+------------+--------+  FV mid          yes                   +--------------+---------+------+-----------+------------+--------+  GSV at [facility]        yes  >500 ms   0.75        +--------------+---------+------+-----------+------------+--------+  GSV prox thigh      yes  >500 ms   0.65        +--------------+---------+------+-----------+------------+--------+  GSV mid thigh       yes  >500 ms   0.75        +--------------+---------+------+-----------+------------+--------+  GSV dist thigh      yes  >500 ms   0.97        +--------------+---------+------+-----------+------------+--------+  GSV at knee  no               0.33        +--------------+---------+------+-----------+------------+--------+  GSV prox calf no               0.31        +--------------+---------+------+-----------+------------+--------+         Summary:  Right:  - No evidence of  deep vein thrombosis seen in the right lower extremity.  - No evidence of superficial venous reflux seen in the right short  saphenous vein.  - The common femoral vein is not competent.  - The great saphenous vein is not competent.    Left:  - No evidence of deep vein thrombosis seen in the left lower extremity.  - No evidence of superficial venous reflux seen in the left short  saphenous vein.  - Deep vein reflux observed.  - The great saphenous vein is not competent.  - Area of focal superficial thrombophlebitis observed in the distal thigh.     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  He has palpable DP/PT pulse B LE.  He is not at risk of limb/toe loss.  He has non history of non healing wounds.    Plan: We have measured him for thigh compression to be worn in the day and taken off at night daily.  20-30 mmgh.  He will continue exercise/work daily.   Elevation of B LE when at rest.  Water therapy if possible.  F/U in 3 months to be considered for laser ablation of the GSV.    Mosetta Pigeon PA-C [facility] Office: [phone]  MD in clinic [place]

## 2020-01-27 ENCOUNTER — Other Ambulatory Visit: Payer: Self-pay

## 2020-01-27 ENCOUNTER — Ambulatory Visit (INDEPENDENT_AMBULATORY_CARE_PROVIDER_SITE_OTHER): Payer: 59 | Admitting: Physician Assistant

## 2020-01-27 ENCOUNTER — Ambulatory Visit (HOSPITAL_COMMUNITY)
Admission: RE | Admit: 2020-01-27 | Discharge: 2020-01-27 | Disposition: A | Discharge status: Home / Self Care | Payer: 59 | Source: Ambulatory Visit | Attending: Vascular Surgery | Admitting: Vascular Surgery

## 2020-01-27 VITALS — BP 135/91 | HR 59 | Temp 98.1°F | Resp 16 | Ht 67.0 in | Wt 224.0 lb

## 2020-01-27 DIAGNOSIS — I83893 Varicose veins of bilateral lower extremities with other complications: Secondary | ICD-10-CM

## 2020-01-27 DIAGNOSIS — I872 Venous insufficiency (chronic) (peripheral): Secondary | ICD-10-CM

## 2020-01-27 DIAGNOSIS — I8393 Asymptomatic varicose veins of bilateral lower extremities: Secondary | ICD-10-CM

## 2020-02-22 IMAGING — DX PORTABLE CHEST - 1 VIEW
1 series · 1 of 1 positions shown · non-contrast
Comparison: March 11, 2019

CLINICAL DATA: Shortness of breath and cough

EXAM:
PORTABLE CHEST 1 VIEW

[chest ap]
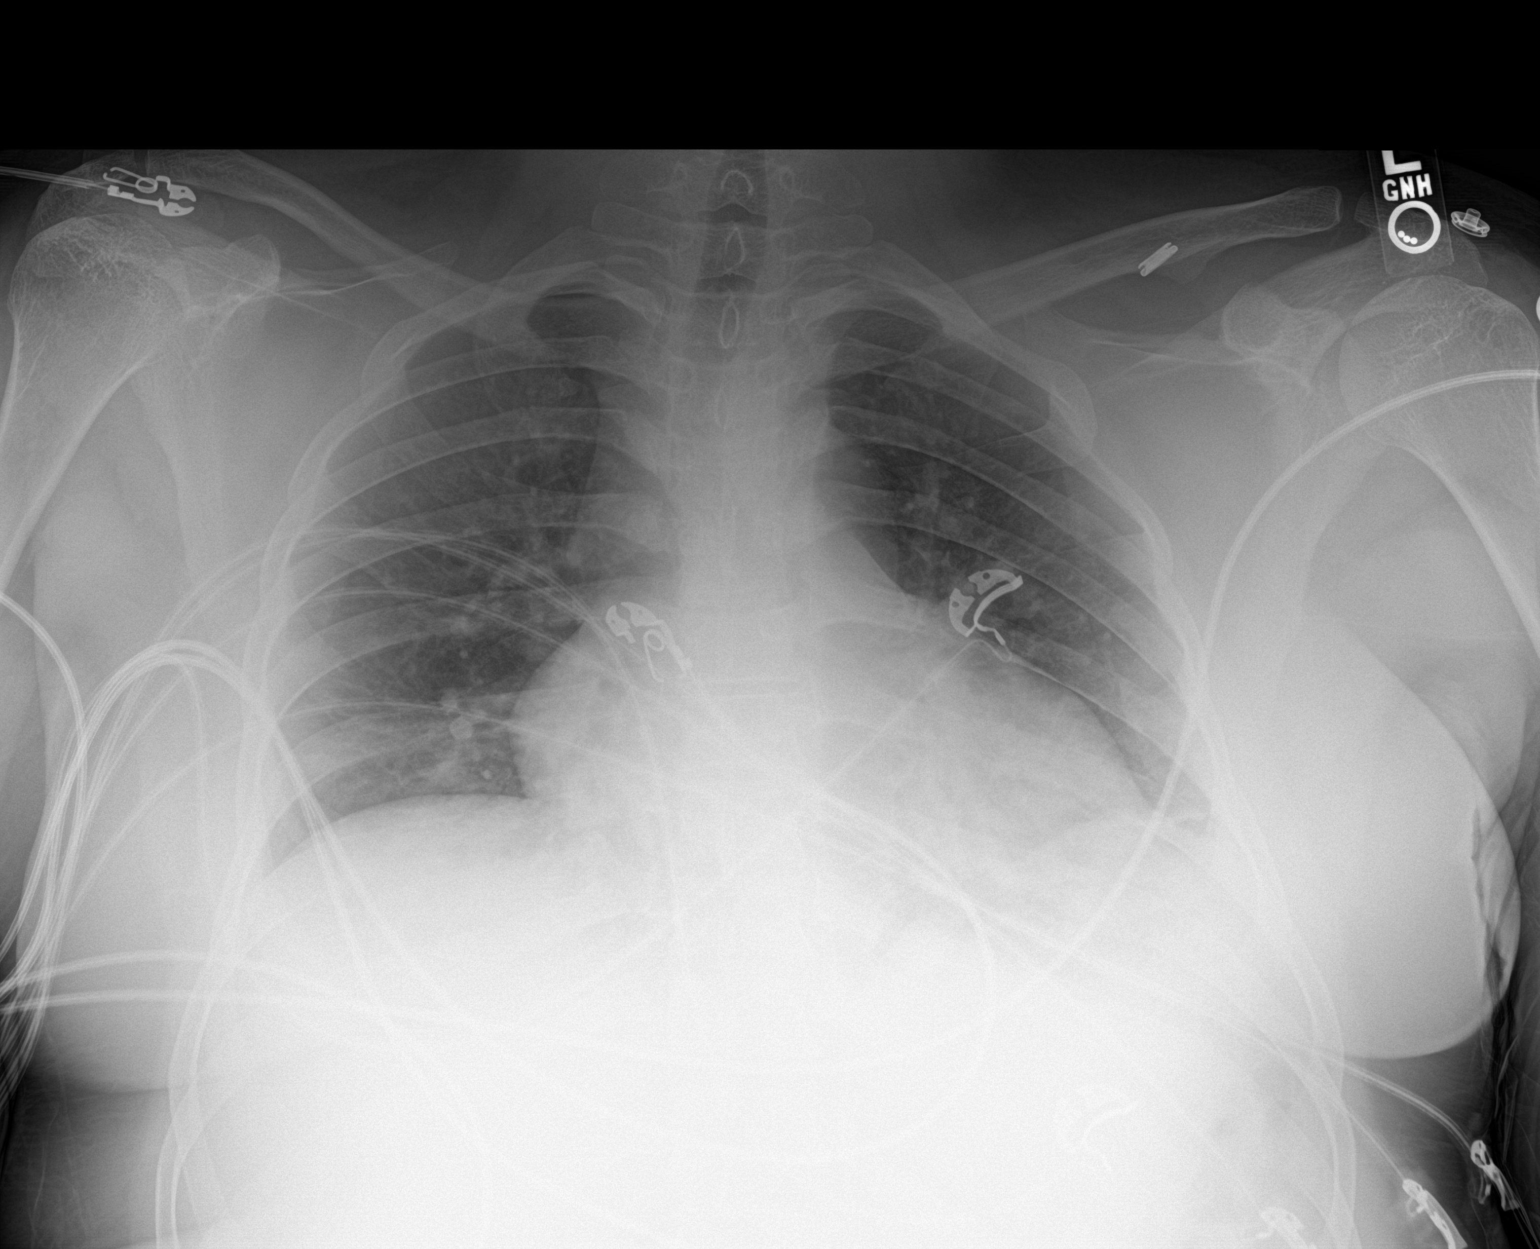

[1 of 1 positions shown; findings below may reference images not displayed]

FINDINGS: Stable cardiomegaly. No pneumothorax. No pulmonary nodules or
masses. No focal infiltrates. Mild bibasilar atelectasis.
IMPRESSION: No active disease.

## 2020-02-24 IMAGING — DX PORTABLE CHEST - 1 VIEW
1 series · 1 of 1 positions shown · non-contrast
Comparison: March 13, 2019

CLINICAL DATA: Chest pain.  Positive [id number] test

EXAM:
PORTABLE CHEST 1 VIEW

[chest ap]
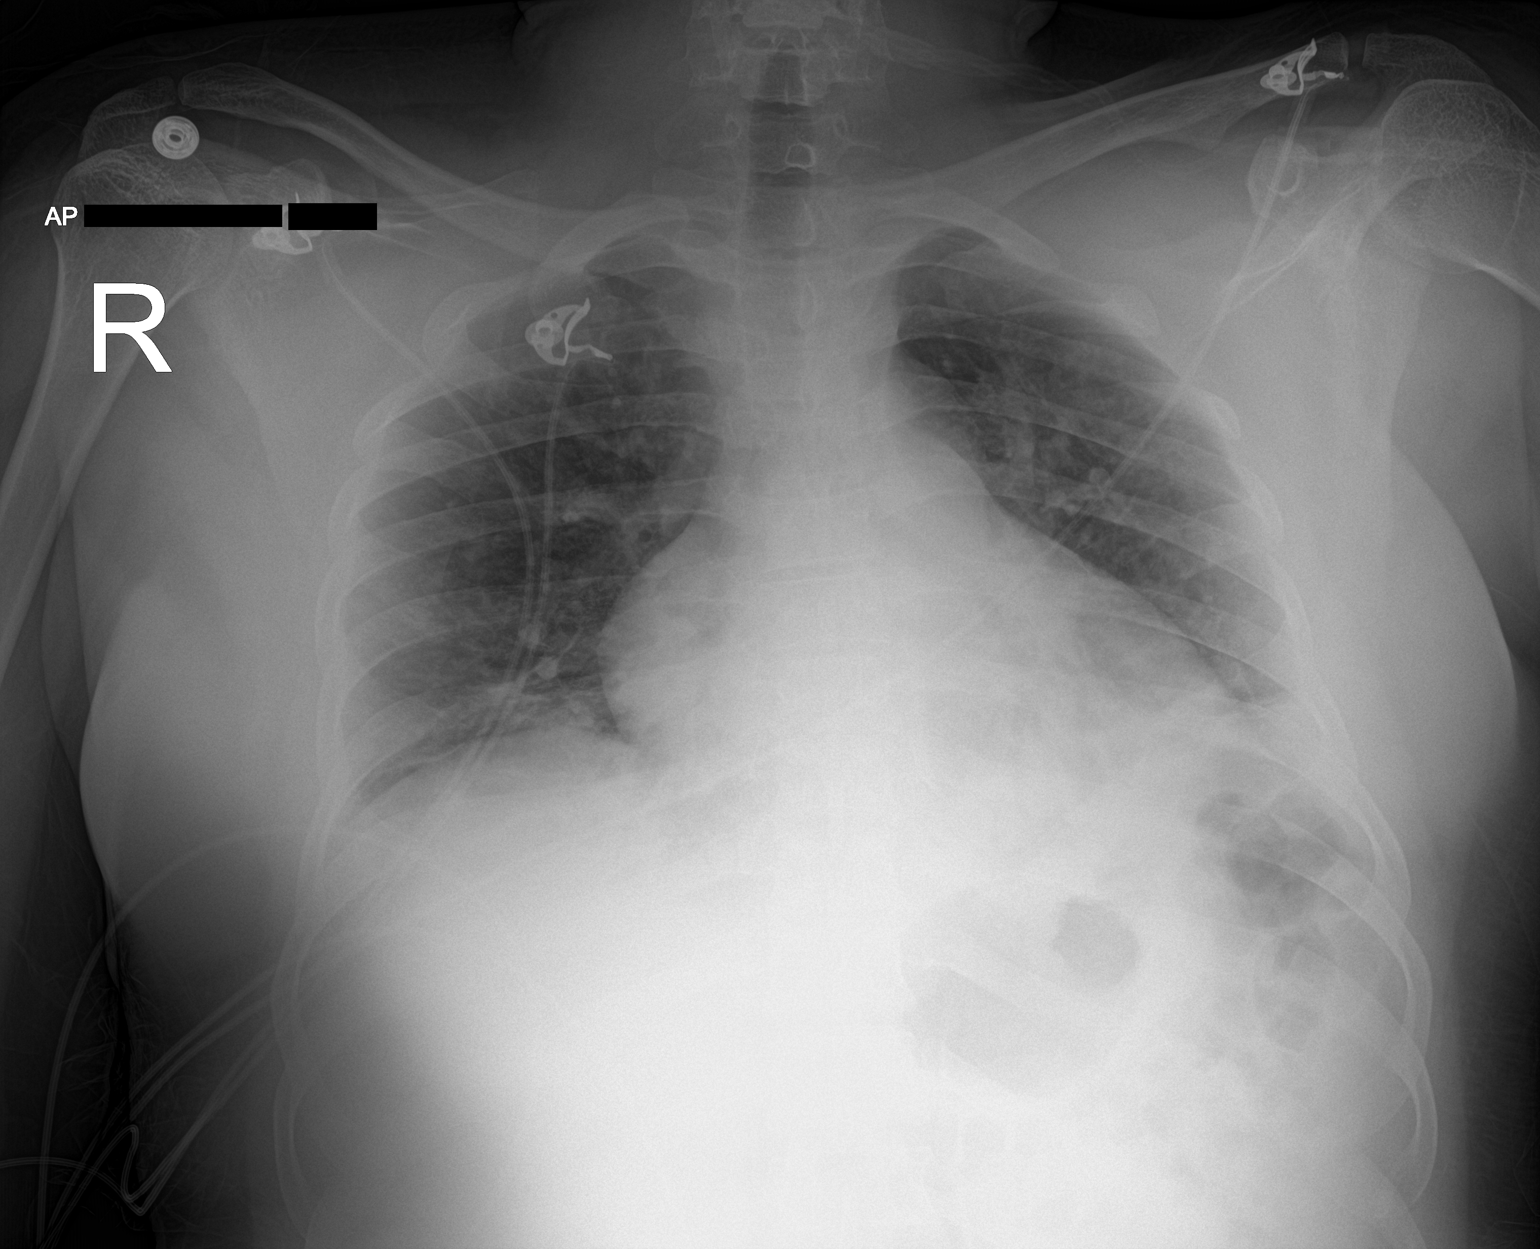

[1 of 1 positions shown; findings below may reference images not displayed]

FINDINGS: There is ill-defined patchy opacity in the lung bases. The lungs
elsewhere are clear. Heart is mildly enlarged with pulmonary
vascularity within normal limits. No adenopathy. No bone lesions.
IMPRESSION: Patchy infiltrate in the bases, slightly more on the left than on
the right. Lungs elsewhere clear. Mild cardiac enlargement.

## 2020-03-13 ENCOUNTER — Telehealth: Payer: Self-pay | Admitting: Nurse Practitioner

## 2020-03-13 NOTE — Telephone Encounter (Signed)
Called to remind Pt of there appointment for tomorrow no answer

## 2020-03-14 ENCOUNTER — Other Ambulatory Visit: Payer: Self-pay | Admitting: Nurse Practitioner

## 2020-03-14 ENCOUNTER — Ambulatory Visit (INDEPENDENT_AMBULATORY_CARE_PROVIDER_SITE_OTHER): Payer: 59 | Admitting: Nurse Practitioner

## 2020-03-14 ENCOUNTER — Other Ambulatory Visit: Payer: Self-pay

## 2020-03-14 ENCOUNTER — Encounter: Payer: Self-pay | Admitting: Nurse Practitioner

## 2020-03-14 VITALS — BP 130/87 | HR 71 | Temp 97.2°F | Resp 16 | Ht 67.0 in | Wt 238.0 lb

## 2020-03-14 DIAGNOSIS — Z0000 Encounter for general adult medical examination without abnormal findings: Secondary | ICD-10-CM

## 2020-03-14 DIAGNOSIS — E669 Obesity, unspecified: Secondary | ICD-10-CM

## 2020-03-14 MED ORDER — FLUTICASONE PROPIONATE 50 MCG/ACT NA SUSP: 2.0000 | Freq: Every day | NASAL | 6 refills | Status: DC

## 2020-03-14 MED FILL — FLUTICASONE PROP 50 MCG SPR: 50 | 60 days supply | Qty: 16 | Fill #0

## 2020-03-14 NOTE — Patient Instructions (Signed)
Healthy Eating Following a healthy eating pattern may help you to achieve and maintain a healthy body weight, reduce the risk of chronic disease, and live a long and productive life. It is important to follow a healthy eating pattern at an appropriate calorie level for your body. Your nutritional needs should be met primarily through food by choosing a variety of nutrient-rich foods. What are tips for following this plan? Reading food labels  Read labels and choose the following: ? Reduced or low sodium. ? Juices with 100% fruit juice. ? Foods with low saturated fats and high polyunsaturated and monounsaturated fats. ? Foods with whole grains, such as whole wheat, cracked wheat, brown rice, and wild rice. ? Whole grains that are fortified with folic acid. This is recommended for women who are pregnant or who want to become pregnant.  Read labels and avoid the following: ? Foods with a lot of added sugars. These include foods that contain brown sugar, corn sweetener, corn syrup, dextrose, fructose, glucose, high-fructose corn syrup, honey, invert sugar, lactose, malt syrup, maltose, molasses, raw sugar, sucrose, trehalose, or turbinado sugar.  Do not eat more than the following amounts of added sugar per day:  6 teaspoons (25 g) for women.  9 teaspoons (38 g) for men. ? Foods that contain processed or refined starches and grains. ? Refined grain products, such as white flour, degermed cornmeal, white bread, and white rice. Shopping  Choose nutrient-rich snacks, such as vegetables, whole fruits, and nuts. Avoid high-calorie and high-sugar snacks, such as potato chips, fruit snacks, and candy.  Use oil-based dressings and spreads on foods instead of solid fats such as butter, stick margarine, or cream cheese.  Limit pre-made sauces, mixes, and "instant" products such as flavored rice, instant noodles, and ready-made pasta.  Try more plant-protein sources, such as tofu, tempeh, black beans,  edamame, lentils, nuts, and seeds.  Explore eating plans such as the Mediterranean diet or vegetarian diet. Cooking  Use oil to saut or stir-fry foods instead of solid fats such as butter, stick margarine, or lard.  Try baking, boiling, grilling, or broiling instead of frying.  Remove the fatty part of meats before cooking.  Steam vegetables in water or broth. Meal planning   At meals, imagine dividing your plate into fourths: ? One-half of your plate is fruits and vegetables. ? One-fourth of your plate is whole grains. ? One-fourth of your plate is protein, especially lean meats, poultry, eggs, tofu, beans, or nuts.  Include low-fat dairy as part of your daily diet. Lifestyle  Choose healthy options in all settings, including home, work, school, restaurants, or stores.  Prepare your food safely: ? Wash your hands after handling raw meats. ? Keep food preparation surfaces clean by regularly washing with hot, soapy water. ? Keep raw meats separate from ready-to-eat foods, such as fruits and vegetables. ? Cook seafood, meat, poultry, and eggs to the recommended internal temperature. ? Store foods at safe temperatures. In general:  Keep cold foods at 59F (4.4C) or below.  Keep hot foods at 159F (60C) or above.  Keep your freezer at [facility] (-17.8C) or below.  Foods are no longer safe to eat when they have been between the temperatures of 40-159F (4.4-60C) for more than 2 hours. What foods should I eat? Fruits Aim to eat 2 cup-equivalents of fresh, canned (in natural juice), or frozen fruits each day. Examples of 1 cup-equivalent of fruit include 1 small apple, 8 large strawberries, 1 cup canned fruit,  cup  dried fruit, or 1 cup 100% juice. Vegetables Aim to eat 2-3 cup-equivalents of fresh and frozen vegetables each day, including different varieties and colors. Examples of 1 cup-equivalent of vegetables include 2 medium carrots, 2 cups raw, leafy greens, 1 cup chopped  vegetable (raw or cooked), or 1 medium baked potato. Grains Aim to eat 6 ounce-equivalents of whole grains each day. Examples of 1 ounce-equivalent of grains include 1 slice of bread, 1 cup ready-to-eat cereal, 3 cups popcorn, or  cup cooked rice, pasta, or cereal. Meats and other proteins Aim to eat 5-6 ounce-equivalents of protein each day. Examples of 1 ounce-equivalent of protein include 1 egg, 1/2 cup nuts or seeds, or 1 tablespoon (16 g) peanut butter. A cut of meat or fish that is the size of a deck of cards is about 3-4 ounce-equivalents.  Of the protein you eat each week, try to have at least 8 ounces come from seafood. This includes salmon, trout, herring, and anchovies. Dairy Aim to eat 3 cup-equivalents of fat-free or low-fat dairy each day. Examples of 1 cup-equivalent of dairy include 1 cup (240 mL) milk, 8 ounces (250 g) yogurt, 1 ounces (44 g) natural cheese, or 1 cup (240 mL) fortified soy milk. Fats and oils  Aim for about 5 teaspoons (21 g) per day. Choose monounsaturated fats, such as canola and olive oils, avocados, peanut butter, and most nuts, or polyunsaturated fats, such as sunflower, corn, and soybean oils, walnuts, pine nuts, sesame seeds, sunflower seeds, and flaxseed. Beverages  Aim for six 8-oz glasses of water per day. Limit coffee to three to five 8-oz cups per day.  Limit caffeinated beverages that have added calories, such as soda and energy drinks.  Limit alcohol intake to no more than 1 drink a day for nonpregnant women and 2 drinks a day for men. One drink equals 12 oz of beer (355 mL), 5 oz of wine (148 mL), or 1 oz of hard liquor (44 mL). Seasoning and other foods  Avoid adding excess amounts of salt to your foods. Try flavoring foods with herbs and spices instead of salt.  Avoid adding sugar to foods.  Try using oil-based dressings, sauces, and spreads instead of solid fats. This information is based on general [place] nutrition guidelines. For more  information, visit [employer]. Exact amounts may vary based on your nutrition needs. Summary  A healthy eating plan may help you to maintain a healthy weight, reduce the risk of chronic diseases, and stay active throughout your life.  Plan your meals. Make sure you eat the right portions of a variety of nutrient-rich foods.  Try baking, boiling, grilling, or broiling instead of frying.  Choose healthy options in all settings, including home, work, school, restaurants, or stores. This information is not intended to replace advice given to you by your health care provider. Make sure you discuss any questions you have with your health care provider. Document Revised: 02/22/2018 Document Reviewed: 02/22/2018 Elsevier Patient Education  [place].

## 2020-03-14 NOTE — Progress Notes (Signed)
[facility] Patient [facility] [address] [place], [place]  [address] Phone:  [phone]   Fax:  [phone]   Established Patient Office Visit  Subjective:  Patient ID: Jon Yates, male    DOB: 01/26/1996  Age: 24 y.o. MRN: [id number]  CC:  Chief Complaint  Patient presents with  . Follow-up    3 month follow up  . Sinus Problem    sinus stuffed up     HPI Jamesyn Lindell [id number] presents for follow-up.  He  has no past medical history on file.   Sinus Pain Patient complains of congestion. Onset of symptoms was several months ago. Symptoms have been stable since that time. He is drinking plenty of fluids.  Past history is significant for nothing. Patient is non-smoker. Denies fever, chills, headache, dizziness, eye pain or pressure, visual changes, cough or drainage , shortness of breath, dyspnea on exertion, chest pain, nausea, vomiting or any edema.  He has not tried any over-the-counter treatment.  He was referred to vein and vascular.  He was seen on 01/27/2020.  He was diagnosed with venous reflux of the common femoral and great saphenous vein of the right lower extremity.  Left lower extremity deep system GS V focal superficial thrombophlebitis and distal thigh.  He was encouraged to wear compression 20 to 30 mmHg.  Keep legs elevated while resting.  Aqua therapy also recommended.  Possible laser ablation of the left lower extremity.  History reviewed. No pertinent past medical history.  History reviewed. No pertinent surgical history.  History reviewed. No pertinent family history.  Social History   Socioeconomic History  . Marital status: Single    Spouse name: Not on file  . Number of children: Not on file  . Years of education: Not on file  . Highest education level: Not on file  Occupational History  . Not on file  Tobacco Use  . Smoking status: Never Smoker  . Smokeless tobacco: Never Used  Substance and Sexual Activity  . Alcohol use: Yes     Comment: weekends-12 beers  . Drug use: Never  . Sexual activity: Yes  Other Topics Concern  . Not on file  Social History Narrative  . Not on file   Social Determinants of Health   Financial Resource Strain:   . Difficulty of Paying Living Expenses:   Food Insecurity:   . Worried About Programme researcher, broadcasting/film/video in the Last Year:   . Barista in the Last Year:   Transportation Needs:   . Freight forwarder (Medical):   Marland Kitchen Lack of Transportation (Non-Medical):   Physical Activity:   . Days of Exercise per Week:   . Minutes of Exercise per Session:   Stress:   . Feeling of Stress :   Social Connections:   . Frequency of Communication with Friends and Family:   . Frequency of Social Gatherings with Friends and Family:   . Attends Religious Services:   . Active Member of Clubs or Organizations:   . Attends Banker Meetings:   Marland Kitchen Marital Status:   Intimate Partner Violence:   . Fear of Current or Ex-Partner:   . Emotionally Abused:   Marland Kitchen Physically Abused:   . Sexually Abused:     No outpatient medications prior to visit.   No facility-administered medications prior to visit.    No Known Allergies  ROS Review of Systems  Constitutional: Negative.   HENT: Negative.  Eyes: Negative.   All other systems reviewed and are negative.     Objective:    Physical Exam  Constitutional: He is oriented to person, place, and time. He appears well-developed.  HENT:  Head: Normocephalic.  Right Ear: External ear normal.  Left Ear: External ear normal.  Nose: Nose normal.  Mouth/Throat: Oropharynx is clear and moist.  Eyes: Pupils are equal, round, and reactive to light.  Cardiovascular: Normal rate, regular rhythm and intact distal pulses.  Pulmonary/Chest: Effort normal and breath sounds normal.  Musculoskeletal:        General: Normal range of motion.     Cervical back: Normal range of motion.  Neurological: He is alert and oriented to person, place,  and time.  Skin: Skin is warm and dry.  Psychiatric: He has a normal mood and affect. His behavior is normal. Judgment and thought content normal.    BP 130/87 (BP Location: Left Arm, Patient Position: Sitting, Cuff Size: Normal)   Pulse 71   Temp (!) 97.2 F (36.2 C) (Oral)   Resp 16   Ht 5\' 7"  (1.702 m)   Wt 238 lb (108 kg)   SpO2 99%   BMI 37.28 kg/m  Wt Readings from Last 3 Encounters:  03/14/20 238 lb (108 kg)  01/27/20 224 lb (101.6 kg)  12/15/19 235 lb (106.6 kg)     Health Maintenance Due  Topic Date Due  . HIV Screening  Never done  . COVID-19 Vaccine (1) Never done    There are no preventive care reminders to display for this patient.  No results found for: TSH Lab Results  Component Value Date   WBC 5.0 04/15/2019   HGB 14.2 04/15/2019   HCT 41.7 04/15/2019   MCV 86 04/15/2019   PLT 240 04/15/2019   Lab Results  Component Value Date   NA 138 04/15/2019   K 4.5 04/15/2019   CO2 22 04/15/2019   GLUCOSE 106 (H) 04/15/2019   BUN 9 04/15/2019   CREATININE 0.74 (L) 04/15/2019   BILITOT 0.4 04/15/2019   ALKPHOS 72 04/15/2019   AST 51 (H) 04/15/2019   ALT 89 (H) 04/15/2019   PROT 7.3 04/15/2019   ALBUMIN 4.7 04/15/2019   CALCIUM 9.2 04/15/2019   ANIONGAP 12 03/17/2019   No results found for: CHOL No results found for: HDL No results found for: [facility] Lab Results  Component Value Date   TRIG 134 03/13/2019   No results found for: CHOLHDL No results found for: HGBA1C    Assessment & Plan:   Problem List Items Addressed This Visit    None    Visit Diagnoses    Healthcare maintenance    -  Primary   We will follow-up for labs and physical   Relevant Orders   CBC with Differential/Platelet   Comp. Metabolic Panel (12)   TSH   Lipid panel   Obesity (BMI 35.0-39.9 without comorbidity)       Encourage diet and exercise      Meds ordered this encounter  Medications  . fluticasone (FLONASE) 50 MCG/ACT nasal spray    Sig: Place 2 sprays  into both nostrils daily.    Dispense:  16 g    Refill:  6    Order Specific Question:   Supervising Provider    Answer:   Tresa Garter [[id number]]    Follow-up: Return in about 6 weeks (around 04/25/2020) for [place], NP

## 2020-04-27 ENCOUNTER — Other Ambulatory Visit: Payer: Self-pay

## 2020-04-27 ENCOUNTER — Encounter: Payer: Self-pay | Admitting: Nurse Practitioner

## 2020-04-27 ENCOUNTER — Ambulatory Visit (INDEPENDENT_AMBULATORY_CARE_PROVIDER_SITE_OTHER): Payer: 59 | Admitting: Nurse Practitioner

## 2020-04-27 VITALS — BP 141/86 | HR 78 | Temp 97.9°F | Ht 67.0 in | Wt 228.0 lb

## 2020-04-27 DIAGNOSIS — R030 Elevated blood-pressure reading, without diagnosis of hypertension: Secondary | ICD-10-CM | POA: Diagnosis not present

## 2020-04-27 DIAGNOSIS — Z0000 Encounter for general adult medical examination without abnormal findings: Secondary | ICD-10-CM

## 2020-04-27 DIAGNOSIS — E669 Obesity, unspecified: Secondary | ICD-10-CM | POA: Diagnosis not present

## 2020-04-27 DIAGNOSIS — J301 Allergic rhinitis due to pollen: Secondary | ICD-10-CM | POA: Diagnosis not present

## 2020-04-27 LAB — POCT URINALYSIS DIPSTICK
Bilirubin, UA: NEGATIVE
Blood, UA: NEGATIVE
Glucose, UA: NEGATIVE
Ketones, UA: NEGATIVE
Leukocytes, UA: NEGATIVE
Nitrite, UA: NEGATIVE
Protein, UA: POSITIVE — AB
Spec Grav, UA: 1.02 (ref 1.010–1.025)
Urobilinogen, UA: 0.2 E.U./dL
pH, UA: 8.5 — AB (ref 5.0–8.0)

## 2020-04-27 MED ORDER — FLUTICASONE PROPIONATE 50 MCG/ACT NA SUSP: 2.0000 | Freq: Every day | NASAL | 6 refills | Status: DC

## 2020-04-27 NOTE — Patient Instructions (Addendum)
Preventive Care 19-24 Years Old, Male Preventive care refers to lifestyle choices and visits with your health care provider that can promote health and wellness. This includes:  A yearly physical exam. This is also called an annual well check.  Regular dental and eye exams.  Immunizations.  Screening for certain conditions.  Healthy lifestyle choices, such as eating a healthy diet, getting regular exercise, not using drugs or products that contain nicotine and tobacco, and limiting alcohol use. What can I expect for my preventive care visit? Physical exam Your health care provider will check:  Height and weight. These may be used to calculate body mass index (BMI), which is a measurement that tells if you are at a healthy weight.  Heart rate and blood pressure.  Your skin for abnormal spots. Counseling Your health care provider may ask you questions about:  Alcohol, tobacco, and drug use.  Emotional well-being.  Home and relationship well-being.  Sexual activity.  Eating habits.  Work and work Statistician. What immunizations do I need?  Influenza (flu) vaccine  This is recommended every year. Tetanus, diphtheria, and pertussis (Tdap) vaccine  You may need a Td booster every 10 years. Varicella (chickenpox) vaccine  You may need this vaccine if you have not already been vaccinated. Human papillomavirus (HPV) vaccine  If recommended by your health care provider, you may need three doses over 6 months. Measles, mumps, and rubella (MMR) vaccine  You may need at least one dose of MMR. You may also need a second dose. Meningococcal conjugate (MenACWY) vaccine  One dose is recommended if you are 45-76 years old and a Market researcher living in a residence hall, or if you have one of several medical conditions. You may also need additional booster doses. Pneumococcal conjugate (PCV13) vaccine  You may need this if you have certain conditions and were not  previously vaccinated. Pneumococcal polysaccharide (PPSV23) vaccine  You may need one or two doses if you smoke cigarettes or if you have certain conditions. Hepatitis A vaccine  You may need this if you have certain conditions or if you travel or work in places where you may be exposed to hepatitis A. Hepatitis B vaccine  You may need this if you have certain conditions or if you travel or work in places where you may be exposed to hepatitis B. Haemophilus influenzae type b (Hib) vaccine  You may need this if you have certain risk factors. You may receive vaccines as individual doses or as more than one vaccine together in one shot (combination vaccines). Talk with your health care provider about the risks and benefits of combination vaccines. What tests do I need? Blood tests  Lipid and cholesterol levels. These may be checked every 5 years starting at age 17.  Hepatitis C test.  Hepatitis B test. Screening   Diabetes screening. This is done by checking your blood sugar (glucose) after you have not eaten for a while (fasting).  Sexually transmitted disease (STD) testing. Talk with your health care provider about your test results, treatment options, and if necessary, the need for more tests. Follow these instructions at home: Eating and drinking   Eat a diet that includes fresh fruits and vegetables, whole grains, lean protein, and low-fat dairy products.  Take vitamin and mineral supplements as recommended by your health care provider.  Do not drink alcohol if your health care provider tells you not to drink.  If you drink alcohol: ? Limit how much you have to 0-2  drinks a day. ? Be aware of how much alcohol is in your drink. In the [place] one drink equals one 12 oz bottle of beer (355 mL), one 5 oz glass of wine (148 mL), or one 1 oz glass of hard liquor (44 mL). Lifestyle  Take daily care of your teeth and gums.  Stay active. Exercise for at least 30 minutes on 5 or  more days each week.  Do not use any products that contain nicotine or tobacco, such as cigarettes, e-cigarettes, and chewing tobacco. If you need help quitting, ask your health care provider.  If you are sexually active, practice safe sex. Use a condom or other form of protection to prevent STIs (sexually transmitted infections). What's next?  Go to your health care provider once a year for a well check visit.  Ask your health care provider how often you should have your eyes and teeth checked.  Stay up to date on all vaccines. This information is not intended to replace advice given to you by your health care provider. Make sure you discuss any questions you have with your health care provider. Document Revised: 11/04/2018 Document Reviewed: 11/04/2018 Elsevier Patient Education  [place].    Preventing Hypertension Hypertension, commonly called high blood pressure, is when the force of blood pumping through the arteries is too strong. Arteries are blood vessels that carry blood from the heart throughout the body. Over time, hypertension can damage the arteries and decrease blood flow to important parts of the body, including the brain, heart, and kidneys. Often, hypertension does not cause symptoms until blood pressure is very high. For this reason, it is important to have your blood pressure checked on a regular basis. Hypertension can often be prevented with diet and lifestyle changes. If you already have hypertension, you can control it with diet and lifestyle changes, as well as medicine. What nutrition changes can be made? Maintain a healthy diet. This includes:  Eating less salt (sodium). Ask your health care provider how much sodium is safe for you to have. The general recommendation is to consume less than 1 tsp (2,300 mg) of sodium a day. ? Do not add salt to your food. ? Choose low-sodium options when grocery shopping and eating out.  Limiting fats in your diet. You  can do this by eating low-fat or fat-free dairy products and by eating less red meat.  Eating more fruits, vegetables, and whole grains. Make a goal to eat: ? 1-2 cups of fresh fruits and vegetables each day. ? 3-4 servings of whole grains each day.  Avoiding foods and beverages that have added sugars.  Eating fish that contain healthy fats (omega-3 fatty acids), such as mackerel or salmon. If you need help putting together a healthy eating plan, try the DASH diet. This diet is high in fruits, vegetables, and whole grains. It is low in sodium, red meat, and added sugars. DASH stands for Dietary Approaches to Stop Hypertension. What lifestyle changes can be made?   Lose weight if you are overweight. Losing just 3?5% of your body weight can help prevent or control hypertension. ? For example, if your present weight is 200 lb (91 kg), a loss of 3-5% of your weight means losing 6-10 lb (2.7-4.5 kg). ? Ask your health care provider to help you with a diet and exercise plan to safely lose weight.  Get enough exercise. Do at least 150 minutes of moderate-intensity exercise each week. ? You could do this in  short exercise sessions several times a day, or you could do longer exercise sessions a few times a week. For example, you could take a brisk 10-minute walk or bike ride, 3 times a day, for 5 days a week.  Find ways to reduce stress, such as exercising, meditating, listening to music, or taking a yoga class. If you need help reducing stress, ask your health care provider.  Do not smoke. This includes e-cigarettes. Chemicals in tobacco and nicotine products raise your blood pressure each time you smoke. If you need help quitting, ask your health care provider.  Avoid alcohol. If you drink alcohol, limit alcohol intake to no more than 1 drink a day for nonpregnant women and 2 drinks a day for men. One drink equals 12 oz of beer, 5 oz of wine, or 1 oz of hard liquor. Why are these changes  important? Diet and lifestyle changes can help you prevent hypertension, and they may make you feel better overall and improve your quality of life. If you have hypertension, making these changes will help you control it and help prevent major complications, such as:  Hardening and narrowing of arteries that supply blood to: ? Your heart. This can cause a heart attack. ? Your brain. This can cause a stroke. ? Your kidneys. This can cause kidney failure.  Stress on your heart muscle, which can cause heart failure. What can I do to lower my risk?  Work with your health care provider to make a hypertension prevention plan that works for you. Follow your plan and keep all follow-up visits as told by your health care provider.  Learn how to check your blood pressure at home. Make sure that you know your personal target blood pressure, as told by your health care provider. How is this treated? In addition to diet and lifestyle changes, your health care provider may recommend medicines to help lower your blood pressure. You may need to try a few different medicines to find what works best for you. You also may need to take more than one medicine. Take over-the-counter and prescription medicines only as told by your health care provider. Where to find support Your health care provider can help you prevent hypertension and help you keep your blood pressure at a healthy level. Your local hospital or your community may also provide support services and prevention programs. The American Heart Association offers an online support network at: CheapBootlegs.com.cy Where to find more information Learn more about hypertension from:  [place], Lung, and Blood Institute: ElectronicHangman.is  Centers for Disease Control and Prevention: https://ingram.com/  American Academy of Family Physicians:  http://familydoctor.org/familydoctor/en/diseases-conditions/high-blood-pressure.printerview.all.html Learn more about the DASH diet from:  [place], Lung, and [place]: https://www.reyes.com/ Contact a health care provider if:  You think you are having a reaction to medicines you have taken.  You have recurrent headaches or feel dizzy.  You have swelling in your ankles.  You have trouble with your vision. Summary  Hypertension often does not cause any symptoms until blood pressure is very high. It is important to get your blood pressure checked regularly.  Diet and lifestyle changes are the most important steps in preventing hypertension.  By keeping your blood pressure in a healthy range, you can prevent complications like heart attack, heart failure, stroke, and kidney failure.  Work with your health care provider to make a hypertension prevention plan that works for you. This information is not intended to replace advice given to you by your health  care provider. Make sure you discuss any questions you have with your health care provider. Document Revised: 03/04/2019 Document Reviewed: 07/21/2016 Elsevier Patient Education  2020 [employer].

## 2020-04-27 NOTE — Progress Notes (Signed)
[facility] [place], [place]  [address] Phone:  [phone]   Fax:  [phone]   New Patient Office Visit  Subjective:  Patient ID: Jon Yates, male    DOB: 01/25/1996  Age: 24 y.o. MRN: [id number]  CC:  Chief Complaint  Patient presents with  . Annual Exam    physical no other concerns     HPI Jon Yates presents for physical. He  has no past medical history on file.   He is checking his weight every 2 weeks. He exercises on the weekend. He will be traveling home in July. Denies headache, dizziness, visual changes, shortness of breath, dyspnea on exertion, chest pain, nausea, vomiting or any edema.    History reviewed. No pertinent past medical history.  History reviewed. No pertinent surgical history.  History reviewed. No pertinent family history.  Social History   Socioeconomic History  . Marital status: Single    Spouse name: Not on file  . Number of children: Not on file  . Years of education: Not on file  . Highest education level: Not on file  Occupational History  . Not on file  Tobacco Use  . Smoking status: Never Smoker  . Smokeless tobacco: Never Used  Substance and Sexual Activity  . Alcohol use: Yes    Comment: 3 beers a month   . Drug use: Never  . Sexual activity: Not Currently  Other Topics Concern  . Not on file  Social History Narrative  . Not on file   Social Determinants of Health   Financial Resource Strain:   . Difficulty of Paying Living Expenses:   Food Insecurity:   . Worried About Charity fundraiser in the Last Year:   . Arboriculturist in the Last Year:   Transportation Needs:   . Film/video editor (Medical):   Marland Kitchen Lack of Transportation (Non-Medical):   Physical Activity:   . Days of Exercise per Week:   . Minutes of Exercise per Session:   Stress:   . Feeling of Stress :   Social Connections:   . Frequency of Communication with Friends and Family:   .  Frequency of Social Gatherings with Friends and Family:   . Attends Religious Services:   . Active Member of Clubs or Organizations:   . Attends Archivist Meetings:   Marland Kitchen Marital Status:   Intimate Partner Violence:   . Fear of Current or Ex-Partner:   . Emotionally Abused:   Marland Kitchen Physically Abused:   . Sexually Abused:     ROS Review of Systems  Constitutional: Negative.   HENT: Negative.   Eyes: Negative.   Respiratory: Negative.   Cardiovascular: Negative.   Endocrine: Negative.   Genitourinary: Negative.   Musculoskeletal: Negative.   Skin: Negative.   Allergic/Immunologic: Negative.   Neurological: Negative.   Hematological: Negative.   Psychiatric/Behavioral: Negative.   All other systems reviewed and are negative.   Objective:   Today's Vitals: BP (!) 141/86 (BP Location: Left Arm, Patient Position: Sitting)   Pulse 78   Temp 97.9 F (36.6 C) (Temporal)   Ht 5\' 7"  (1.702 m)   Wt 228 lb (103.4 kg)   SpO2 100%   BMI 35.71 kg/m   Physical Exam Exam conducted with a chaperone present.  Constitutional:      Appearance: He is obese.  HENT:     Head: Normocephalic and atraumatic.  Nose: Nose normal.     Mouth/Throat:     Mouth: Mucous membranes are dry.  Eyes:     Extraocular Movements: Extraocular movements intact.     Conjunctiva/sclera: Conjunctivae normal.     Pupils: Pupils are equal, round, and reactive to light.  Cardiovascular:     Rate and Rhythm: Normal rate and regular rhythm.     Pulses: Normal pulses.     Heart sounds: Normal heart sounds.  Pulmonary:     Effort: Pulmonary effort is normal.     Breath sounds: Normal breath sounds.  Abdominal:     General: Abdomen is flat. Bowel sounds are normal.     Palpations: Abdomen is soft.  Genitourinary:    Penis: Normal and circumcised.      Testes: Normal.     Epididymis:     Right: Normal.     Left: Normal.  Musculoskeletal:        General: Normal range of motion.     Cervical  back: Normal range of motion.  Skin:    General: Skin is warm and dry.     Capillary Refill: Capillary refill takes less than 2 seconds.  Neurological:     General: No focal deficit present.     Mental Status: He is alert and oriented to person, place, and time.  Psychiatric:        Mood and Affect: Mood normal.        Behavior: Behavior normal.        Thought Content: Thought content normal.        Judgment: Judgment normal.     Assessment & Plan:   Problem List Items Addressed This Visit    None    Visit Diagnoses    Physical exam    -  Primary   Relevant Orders   POCT Urinalysis Dipstick (Completed)   STD Screen (8)   Obesity (BMI 35.0-39.9 without comorbidity)       Relevant Orders   STD Screen (8)   Elevated blood pressure reading       Routine general medical examination at a health care facility       Relevant Orders   STD Screen (8)   Seasonal allergic rhinitis due to pollen       Relevant Medications   fluticasone (FLONASE) 50 MCG/ACT nasal spray      Outpatient Encounter Medications as of 04/27/2020  Medication Sig  . fluticasone (FLONASE) 50 MCG/ACT nasal spray Place 2 sprays into both nostrils daily.  . [DISCONTINUED] fluticasone (FLONASE) 50 MCG/ACT nasal spray Place 2 sprays into both nostrils daily.   No facility-administered encounter medications on file as of 04/27/2020.    Follow-up: Return in about 1 year (around 04/27/2021).   Barbette Merino, NP

## 2020-04-28 LAB — STD SCREEN (8)
HIV Screen 4th Generation wRfx: NONREACTIVE
HSV 1 Glycoprotein G Ab, IgG: 46.1 index — ABNORMAL HIGH (ref 0.00–0.90)
HSV 2 IgG, Type Spec: <0.91 index (ref 0.00–0.90)
Hep A IgM: NEGATIVE
Hep B C IgM: NEGATIVE
Hep C Virus Ab: <0.1 s/co ratio (ref 0.0–0.9)
Hepatitis B Surface Ag: NEGATIVE
RPR Ser Ql: NONREACTIVE

## 2020-05-02 ENCOUNTER — Ambulatory Visit (INDEPENDENT_AMBULATORY_CARE_PROVIDER_SITE_OTHER): Payer: 59 | Admitting: Vascular Surgery

## 2020-05-02 ENCOUNTER — Encounter: Payer: Self-pay | Admitting: Vascular Surgery

## 2020-05-02 VITALS — BP 117/78 | HR 65 | Temp 97.9°F | Resp 18 | Ht 67.0 in | Wt 225.7 lb

## 2020-05-02 DIAGNOSIS — I83813 Varicose veins of bilateral lower extremities with pain: Secondary | ICD-10-CM

## 2020-05-02 NOTE — Progress Notes (Signed)
Patient name: Jon Yates [id number] MRN: [id number] DOB: [birth year] Sex: male   HPI: Jon Yates [id number] is a 24 y.o. male, returns for follow-up today.  He was last seen in March 2021.  He still has heaviness fullness and aching in his varicose veins in both lower extremities as the day progresses.  His legs are more fresh in the morning time.  He also has some skin itching especially around the knees on both sides.  He has no family history of varicose veins.  He has no prior history of DVT.  He states the compression stockings have improved his symptoms slightly but certainly have not made them resolve completely.   No past medical history on file. No past surgical history on file.  No family history on file.  SOCIAL HISTORY: Social History   Socioeconomic History  . Marital status: Single    Spouse name: Not on file  . Number of children: Not on file  . Years of education: Not on file  . Highest education level: Not on file  Occupational History  . Not on file  Tobacco Use  . Smoking status: Never Smoker  . Smokeless tobacco: Never Used  Substance and Sexual Activity  . Alcohol use: Yes    Comment: 3 beers a month   . Drug use: Never  . Sexual activity: Not Currently  Other Topics Concern  . Not on file  Social History Narrative  . Not on file   Social Determinants of Health   Financial Resource Strain:   . Difficulty of Paying Living Expenses:   Food Insecurity:   . Worried About Charity fundraiser in the Last Year:   . Arboriculturist in the Last Year:   Transportation Needs:   . Film/video editor (Medical):   Marland Kitchen Lack of Transportation (Non-Medical):   Physical Activity:   . Days of Exercise per Week:   . Minutes of Exercise per Session:   Stress:   . Feeling of Stress :   Social Connections:   . Frequency of Communication with Friends and Family:   . Frequency of Social Gatherings with Friends and Family:   . Attends Religious Services:   . Active  Member of Clubs or Organizations:   . Attends Archivist Meetings:   Marland Kitchen Marital Status:   Intimate Partner Violence:   . Fear of Current or Ex-Partner:   . Emotionally Abused:   Marland Kitchen Physically Abused:   . Sexually Abused:     No Known Allergies  Current Outpatient Medications  Medication Sig Dispense Refill  . fluticasone (FLONASE) 50 MCG/ACT nasal spray Place 2 sprays into both nostrils daily. 16 g 6   No current facility-administered medications for this visit.    ROS:   General:  No weight loss, Fever, chills  HEENT: No recent headaches, no nasal bleeding, no visual changes, no sore throat  Neurologic: No dizziness, blackouts, seizures. No recent symptoms of stroke or mini- stroke. No recent episodes of slurred speech, or temporary blindness.  Cardiac: No recent episodes of chest pain/pressure, no shortness of breath at rest.  No shortness of breath with exertion.  Denies history of atrial fibrillation or irregular heartbeat  Vascular: No history of rest pain in feet.  No history of claudication.  No history of non-healing ulcer, No history of DVT   Pulmonary: No home oxygen, no productive cough, no hemoptysis,  No asthma or wheezing  Musculoskeletal:  [ ]  Arthritis, [ ]   Low back pain,  [ ]  Joint pain  Hematologic:No history of hypercoagulable state.  No history of easy bleeding.  No history of anemia  Gastrointestinal: No hematochezia or melena,  No gastroesophageal reflux, no trouble swallowing  Urinary: [ ]  chronic Kidney disease, [ ]  on HD - [ ]  MWF or [ ]  TTHS, [ ]  Burning with urination, [ ]  Frequent urination, [ ]  Difficulty urinating;   Skin: No rashes  Psychological: No history of anxiety,  No history of depression   Physical Examination  There were no vitals filed for this visit.  There is no height or weight on file to calculate BMI.  General:  Alert and oriented, no acute distress HEENT: Normal Neck: No JVD Cardiac: Regular Rate and  Rhythm Skin: No rash, diffuse bulging varicosities both legs medial lateral calf covering a surface area of about 10 cm each vein is probably 4 to 5 mm diameter. Extremity Pulses:  2+ dorsalis pedis  pulses bilaterally Musculoskeletal: No deformity or edema  Neurologic: Upper and lower extremity motor 5/5 and symmetric  DATA: Patient had a venous reflux exam performed on January 27, 2020.  I reviewed and interpreted that study today.  Left and right greater saphenous vein had diffuse reflux with 7 to 8 mm diameter vein.   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  I discussed with patient the pathophysiology of venous disease today.  Also discussed with him the possibility of laser ablation and stab avulsions of both legs.  The patient is traveling out of the country in the next few months and states that he may want to consider laser ablation but not until sometime in the fall of this year.  He will call back if he wishes to pursue that in the future.  Otherwise he will continue to wear his compression stockings.   PLAN: Patient will continue to wear his compression stockings.  Laser ablation would be a consideration if patient wishes to pursue this at some point in future.   , MD [facility] Office: [phone]

## 2020-05-10 ENCOUNTER — Emergency Department (HOSPITAL_COMMUNITY)
Admission: EM | Admit: 2020-05-10 | Discharge: 2020-05-11 | Disposition: A | Discharge status: Home / Self Care | Payer: 59 | Attending: Emergency Medicine | Admitting: Emergency Medicine

## 2020-05-10 ENCOUNTER — Encounter (HOSPITAL_COMMUNITY): Payer: Self-pay

## 2020-05-10 ENCOUNTER — Other Ambulatory Visit: Payer: Self-pay

## 2020-05-10 DIAGNOSIS — Z79899 Other long term (current) drug therapy: Secondary | ICD-10-CM | POA: Insufficient documentation

## 2020-05-10 DIAGNOSIS — M545 Low back pain: Secondary | ICD-10-CM | POA: Diagnosis not present

## 2020-05-10 DIAGNOSIS — T148XXA Other injury of unspecified body region, initial encounter: Secondary | ICD-10-CM

## 2020-05-10 NOTE — ED Triage Notes (Signed)
Pt arrives to ED w/ c/o back pain and intermittent weakness in R hand.

## 2020-05-11 ENCOUNTER — Other Ambulatory Visit: Payer: Self-pay

## 2020-05-11 ENCOUNTER — Emergency Department (HOSPITAL_COMMUNITY): Payer: 59

## 2020-05-11 LAB — CBC
HCT: 43.6 % (ref 39.0–52.0)
Hemoglobin: 14 g/dL (ref 13.0–17.0)
MCH: 28.9 pg (ref 26.0–34.0)
MCHC: 32.1 g/dL (ref 30.0–36.0)
MCV: 90.1 fL (ref 80.0–100.0)
Platelets: 225 10*3/uL (ref 150–400)
RBC: 4.84 MIL/uL (ref 4.22–5.81)
RDW: 13.1 % (ref 11.5–15.5)
WBC: 4.7 10*3/uL (ref 4.0–10.5)
nRBC: 0 % (ref 0.0–0.2)

## 2020-05-11 LAB — BASIC METABOLIC PANEL
Anion gap: 8 (ref 5–15)
BUN: 8 mg/dL (ref 6–20)
CO2: 27 mmol/L (ref 22–32)
Calcium: 9.2 mg/dL (ref 8.9–10.3)
Chloride: 102 mmol/L (ref 98–111)
Creatinine, Ser: 0.84 mg/dL (ref 0.61–1.24)
GFR calc Af Amer: >60 mL/min (ref >60)
GFR calc non Af Amer: >60 mL/min (ref >60)
Glucose, Bld: 97 mg/dL (ref 70–99)
Potassium: 4.2 mmol/L (ref 3.5–5.1)
Sodium: 137 mmol/L (ref 135–145)

## 2020-05-11 MED ORDER — NAPROXEN 375 MG PO TABS: 375.0000 mg | ORAL_TABLET | Freq: Two times a day (BID) | ORAL | 0 refills | Status: AC

## 2020-05-11 MED ORDER — LIDOCAINE 5 % EX PTCH: 1.0000 | MEDICATED_PATCH | CUTANEOUS | 0 refills | Status: AC

## 2020-05-11 NOTE — ED Notes (Signed)
Patient transported to X-ray 

## 2020-05-11 NOTE — Discharge Instructions (Addendum)
You were seen today for muscle strain, I want you to use the lidocaine patch as prescribed and as we talked about.  I want you to use the guide attached.  I want you to try and lift as light as you can for the next couple of days.  I want you to take the naproxen as we discussed.  Come back to the emergency department if you have any new or worsening concerning symptoms.  I want you to follow-up with your primary care.   Self - care:  The application of heat can help soothe the pain.  Maintaining your daily activities, including walking, is encourged, as it will help you get better faster than just staying in bed.  Non steroidal anti inflammatory medications including Ibuprofen and naproxen;  These medications help both pain and swelling and are very useful in treating back pain.  They should be taken with food, as they can cause stomach upset, and more seriously, stomach bleeding.     Be aware that if you develop new symptoms, such as a fever, leg weakness, difficulty with or loss of control of your urine or bowels, abdominal pain, or more severe pain, you will need to seek medical attention and  / or return to the Emergency department.  If you do not have a doctor see the list below.

## 2020-05-11 NOTE — ED Provider Notes (Signed)
[facility] EMERGENCY DEPARTMENT Provider Note   CSN: [id number] Arrival date & time: 05/10/20  1912     History Chief Complaint  Patient presents with  . Back Pain    Jon Yates is a 24 y.o. male with no pertinent past medical history that presents emergency department today for back pain that started over a month ago.  Patient speaks  [place], interpreter was used.  Patient states that he is being evaluated by PCP for this, states that he is explicitly here because he wants a work note stating that he can go back to work until his pain is fully resolved.  Patient states that it takes too long to get into his PCP for now, therefore he came here.  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  States that the pain only occurs at work, does not radiate anywhere.  States that pain is a 2/10, describes it as sharp and worse with certain movement.  Denies any chest pain, shortness of breath, fevers, chills, nausea, vomiting, abdominal pain, IV drug use, saddle paresthesias, weakness.  Patient states that his left hand feels weaker than his right hand.  Denies any numbness or tingling.  States that he has been walking normally.  Denies any dysuria or hematuria, retention or incontinence.  Denies any URI symptoms, denies any recent URI.  Was prescribed Voltaren cream for this, states that this has been helping.     History reviewed. No pertinent past medical history.  Patient Active Problem List   Diagnosis Date Noted  . Acute on chronic respiratory failure with hypoxia (HCC) 03/16/2019  . SIRS (systemic inflammatory response syndrome) (HCC) 03/13/2019  . Acute respiratory disease due to COVID-19 virus 03/13/2019    History reviewed. No pertinent surgical history.     No family history on file.  Social History   Tobacco Use  . Smoking status: Never Smoker  . Smokeless tobacco: Never Used   Substance Use Topics  . Alcohol use: Yes    Comment: 3 beers a month   . Drug use: Never    Home Medications Prior to Admission medications   Medication Sig Start Date End Date Taking? Authorizing Provider  fluticasone (FLONASE) 50 MCG/ACT nasal spray Place 2 sprays into both nostrils daily. 04/27/20   Barbette Merino, NP  lidocaine (LIDODERM) 5 % Place 1 patch onto the skin daily. Remove & Discard patch within 12 hours or as directed by MD 05/11/20   Farrel Gordon, PA-C  naproxen (NAPROSYN) 375 MG tablet Take 1 tablet (375 mg total) by mouth 2 (two) times daily. 05/11/20   Farrel Gordon, PA-C    Allergies    Patient has no known allergies.  Review of Systems   Review of Systems  Constitutional: Negative for chills, diaphoresis, fatigue and fever.  HENT: Negative for congestion, sore throat and trouble swallowing.   Eyes: Negative for pain and visual disturbance.  Respiratory: Negative for cough, shortness of breath and wheezing.   Cardiovascular: Negative for chest pain, palpitations and leg swelling.  Gastrointestinal: Negative for abdominal distention, abdominal pain, diarrhea, nausea and vomiting.  Genitourinary: Negative for difficulty urinating.  Musculoskeletal: Positive for back pain. Negative for neck pain and neck stiffness.  Skin: Negative for pallor.  Neurological: Negative for dizziness, speech difficulty, weakness and headaches.  Psychiatric/Behavioral: Negative for confusion.    Physical Exam Updated Vital Signs BP 139/86 (BP Location: Left Arm)  Pulse 65   Temp 97.9 F (36.6 C) (Oral)   Resp 16   SpO2 100%   Physical Exam Constitutional:      General: He is not in acute distress.    Appearance: Normal appearance. He is not ill-appearing, toxic-appearing or diaphoretic.  HENT:     Mouth/Throat:     Mouth: Mucous membranes are moist.     Pharynx: Oropharynx is clear.  Eyes:     General: No scleral icterus.    Extraocular Movements: Extraocular movements  intact.     Pupils: Pupils are equal, round, and reactive to light.  Cardiovascular:     Rate and Rhythm: Normal rate and regular rhythm.     Pulses: Normal pulses.     Heart sounds: Normal heart sounds.  Pulmonary:     Effort: Pulmonary effort is normal. No respiratory distress.     Breath sounds: Normal breath sounds. No stridor. No wheezing, rhonchi or rales.  Chest:     Chest wall: No tenderness.  Abdominal:     General: Abdomen is flat. There is no distension.     Palpations: Abdomen is soft.     Tenderness: There is no abdominal tenderness. There is no guarding or rebound.  Musculoskeletal:        General: No swelling or tenderness. Normal range of motion.     Cervical back: Normal range of motion and neck supple. No rigidity.     Right lower leg: No edema.     Left lower leg: No edema.     Comments: No cervical, thoracic, lumbar midline tenderness.  No paraspinal muscle tenderness.  There is tenderness to left rib cage on the anterior, lateral, posterior side.  No overlying signs of rash or infection.  Patient with normal sensation and strength to right and left hand.  No weakness noted on fingers, wrist, elbow, shoulder joints.  Normal strength and sensation in bilateral lower extremities as well.  Skin:    General: Skin is warm and dry.     Capillary Refill: Capillary refill takes less than 2 seconds.     Coloration: Skin is not pale.  Neurological:     General: No focal deficit present.     Mental Status: He is alert and oriented to person, place, and time.     Cranial Nerves: Cranial nerves are intact.     Sensory: Sensation is intact.     Motor: Motor function is intact.     Gait: Gait is intact.     Comments: Alert and oriented x3.. Clear speech. No facial droop. CNIII-XII grossly intact. Bilateral upper and lower extremities' sensation grossly intact. 5/5 symmetric strength with grip strength and with plantar and dorsi flexion bilaterally. Patellar  Negative pronator  drift. NGait is steady and intact.   Psychiatric:        Mood and Affect: Mood normal.        Behavior: Behavior normal.     ED Results / Procedures / Treatments   Labs (all labs ordered are listed, but only abnormal results are displayed) Labs Reviewed  CBC  BASIC METABOLIC PANEL    EKG EKG Interpretation  Date/Time:  Friday May 11 2020 09:15:22 EDT Ventricular Rate:  53 PR Interval:  180 QRS Duration: 100 QT Interval:  428 QTC Calculation: 401 R Axis:   63 Text Interpretation: Sinus bradycardia with sinus arrhythmia Otherwise normal ECG Since last tracing rate slower Confirmed by Linwood Dibbles [phone]) on 05/11/2020 9:38:42 AM   Radiology  DG Chest 2 View  Result Date: 05/11/2020 CLINICAL DATA:  Rib pain. EXAM: CHEST - 2 VIEW COMPARISON:  05/25/2019 FINDINGS: The heart size and mediastinal contours are within normal limits. Both lungs are clear. The visualized skeletal structures are unremarkable. IMPRESSION: No active cardiopulmonary disease. Electronically Signed   By: Kerby Moors M.D.   On: 05/11/2020 10:09    Procedures Procedures (including critical care time)  Medications Ordered in ED Medications - No data to display  ED Course  I have reviewed the triage vital signs and the nursing notes.  Pertinent labs & imaging results that were available during my care of the patient were reviewed by me and considered in my medical decision making (see chart for details).    MDM Rules/Calculators/A&P                         Jon Yates is a 24 y.o. male with no pertinent past medical history that presents emergency department today for back pain that started over a month ago.Patient with tenderness to serratus muscles, rates pain a  2/10. Normal strength and 5/5 strength in left and right hand, normal sensation in bilateral upper and lower extremities.  Patient with completely normal neuro exam.  Patient states that he is here for a longer work note since he has  not been able to see his primary care for this.   CBC and CMP stable.  Chest x-ray without any acute cardiopulmonary disease, no fractures noticed.  EKG without any ischemic changes. Patient to be discharged, patient is agreeable..Doubt need for further emergent work up at this time. I explained the diagnosis and have given explicit precautions to return to the ER including for any other new or worsening symptoms. The patient understands and accepts the medical plan as it's been dictated and I have answered their questions. Discharge instructions concerning home care and prescriptions have been given. The patient is STABLE and is discharged to home in good condition.  Patient given muscle strain information, communicated that patient should take naproxen and use Lidoderm patches sensory and to try restraint lifting for the next 2 weeks.  Expressed to patient that we are not going to give him any long-term work note, expressed that this needs to come from primary care.    Clinical Impression(s) / ED Diagnoses Final diagnoses:  Muscle strain    Rx / DC Orders ED Discharge Orders         Ordered    lidocaine (LIDODERM) 5 %  Every 24 hours     Discontinue  Reprint     05/11/20 1037    naproxen (NAPROSYN) 375 MG tablet  2 times daily     Discontinue  Reprint     05/11/20 Tatum, Macie Baum, PA-C 05/11/20 1042    Dorie Rank, MD 05/12/20 0710

## 2020-12-24 ENCOUNTER — Encounter: Payer: Self-pay | Admitting: Nurse Practitioner

## 2020-12-24 ENCOUNTER — Ambulatory Visit (INDEPENDENT_AMBULATORY_CARE_PROVIDER_SITE_OTHER): Payer: 59 | Admitting: Nurse Practitioner

## 2020-12-24 ENCOUNTER — Other Ambulatory Visit: Payer: Self-pay

## 2020-12-24 ENCOUNTER — Other Ambulatory Visit: Payer: Self-pay | Admitting: Nurse Practitioner

## 2020-12-24 VITALS — BP 125/85 | HR 71 | Temp 97.9°F | Ht 67.0 in | Wt 225.0 lb

## 2020-12-24 DIAGNOSIS — E669 Obesity, unspecified: Secondary | ICD-10-CM

## 2020-12-24 DIAGNOSIS — R0989 Other specified symptoms and signs involving the circulatory and respiratory systems: Secondary | ICD-10-CM | POA: Diagnosis not present

## 2020-12-24 DIAGNOSIS — M79601 Pain in right arm: Secondary | ICD-10-CM | POA: Diagnosis not present

## 2020-12-24 DIAGNOSIS — R29898 Other symptoms and signs involving the musculoskeletal system: Secondary | ICD-10-CM

## 2020-12-24 DIAGNOSIS — J301 Allergic rhinitis due to pollen: Secondary | ICD-10-CM

## 2020-12-24 MED ORDER — FLUTICASONE PROPIONATE 50 MCG/ACT NA SUSP: 2.0000 | Freq: Every day | NASAL | 6 refills | Status: DC

## 2020-12-24 MED FILL — FLUTICASONE PROP 50 MCG SPR: 50 | 30 days supply | Qty: 16 | Fill #1

## 2020-12-24 NOTE — Progress Notes (Signed)
[facility] Patient [facility] [address] [place], [place]  [address] Phone:  [phone]   Fax:  [phone]   Established Patient Office Visit  Subjective:  Patient ID: Jon Yates, male    DOB: [birth year]  Age: 25 y.o. MRN: [id number]  CC:  Chief Complaint  Patient presents with  . Follow-up    Having runny nose ,sinus problems, no fever     HPI Jon Yates presents for runny nose and sinus problems.   Sinus Pain Patient complains of post nasal drip and sneezing. Onset of symptoms was a few months ago. Symptoms have been gradually worsening since that time. He is drinking plenty of fluids.  Past history is significant for seasonal allergies. Patient is non-smoker.  He is also complaining of right arm weakness with lifting objects over his head. He has noticed this maybe related to an birth injury. He is requesting a work note for accommodations to get a new job/. History reviewed. No pertinent past medical history.  History reviewed. No pertinent surgical history.  History reviewed. No pertinent family history.  Social History   Socioeconomic History  . Marital status: Single    Spouse name: Not on file  . Number of children: Not on file  . Years of education: Not on file  . Highest education level: Not on file  Occupational History  . Not on file  Tobacco Use  . Smoking status: Never Smoker  . Smokeless tobacco: Never Used  Substance and Sexual Activity  . Alcohol use: Yes    Comment: 3 beers a month   . Drug use: Never  . Sexual activity: Not Currently  Other Topics Concern  . Not on file  Social History Narrative  . Not on file   Social Determinants of Health   Financial Resource Strain: Not on file  Food Insecurity: Not on file  Transportation Needs: Not on file  Physical Activity: Not on file  Stress: Not on file  Social Connections: Not on file  Intimate Partner Violence: Not on file    Outpatient Medications Prior to  Visit  Medication Sig Dispense Refill  . fluticasone (FLONASE) 50 MCG/ACT nasal spray Place 2 sprays into both nostrils daily. 16 g 6  . lidocaine (LIDODERM) 5 % Place 1 patch onto the skin daily. Remove & Discard patch within 12 hours or as directed by MD (Patient not taking: Reported on 12/24/2020) 30 patch 0  . naproxen (NAPROSYN) 375 MG tablet Take 1 tablet (375 mg total) by mouth 2 (two) times daily. (Patient not taking: Reported on 12/24/2020) 20 tablet 0   No facility-administered medications prior to visit.    No Known Allergies  ROS Review of Systems    Objective:    Physical Exam HENT:     Nose: Nose normal.     Right Nostril: No epistaxis.     Left Nostril: No epistaxis.     Right Turbinates: Not enlarged.     Left Turbinates: Not enlarged.     Right Sinus: No maxillary sinus tenderness or frontal sinus tenderness.     Left Sinus: No maxillary sinus tenderness or frontal sinus tenderness.     Mouth/Throat:     Mouth: Mucous membranes are moist.     Pharynx: Oropharynx is clear. Uvula midline.     Tonsils: No tonsillar exudate or tonsillar abscesses. 2+ on the left.  Musculoskeletal:     Cervical back: Full passive range of motion without pain.  BP 125/85 (BP Location: Right Arm, Patient Position: Sitting, Cuff Size: Large)   Pulse 71   Temp 97.9 F (36.6 C) (Temporal)   Ht 5\' 7"  (1.702 m)   Wt 225 lb (102.1 kg)   SpO2 98%   BMI 35.24 kg/m  Wt Readings from Last 3 Encounters:  12/24/20 225 lb (102.1 kg)  05/02/20 225 lb 11.2 oz (102.4 kg)  04/27/20 228 lb (103.4 kg)     Health Maintenance Due  Topic Date Due  . COVID-19 Vaccine (1) Never done  . TETANUS/TDAP  Never done  . INFLUENZA VACCINE  06/24/2020    There are no preventive care reminders to display for this patient.  No results found for: TSH Lab Results  Component Value Date   WBC 4.7 05/11/2020   HGB 14.0 05/11/2020   HCT 43.6 05/11/2020   MCV 90.1 05/11/2020   PLT 225 05/11/2020    Lab Results  Component Value Date   NA 137 05/11/2020   K 4.2 05/11/2020   CO2 27 05/11/2020   GLUCOSE 97 05/11/2020   BUN 8 05/11/2020   CREATININE 0.84 05/11/2020   BILITOT 0.4 04/15/2019   ALKPHOS 72 04/15/2019   AST 51 (H) 04/15/2019   ALT 89 (H) 04/15/2019   PROT 7.3 04/15/2019   ALBUMIN 4.7 04/15/2019   CALCIUM 9.2 05/11/2020   ANIONGAP 8 05/11/2020   No results found for: CHOL No results found for: HDL No results found for: [facility] Lab Results  Component Value Date   TRIG 134 03/13/2019   No results found for: CHOLHDL No results found for: 03/15/2019    Assessment & Plan:   Problem List Items Addressed This Visit   None   Visit Diagnoses    Obesity (BMI 35.0-39.9 without comorbidity)    -  Primary   Sinus symptom     Mucinex D daily for 10 days Restart Flonase Encourage oral antihistamine if symptoms persist   Seasonal allergic rhinitis due to pollen       Relevant Medications   fluticasone (FLONASE) 50 MCG/ACT nasal spray   Pain of right upper extremity       Relevant Orders   Ambulatory referral to Physical Therapy   Right arm weakness    In order to provide proper documentation of his abilities a referral for an upper body FCE has been placed.     Relevant Orders   Ambulatory referral to Physical Therapy      Meds ordered this encounter  Medications  . fluticasone (FLONASE) 50 MCG/ACT nasal spray    Sig: Place 2 sprays into both nostrils daily.    Dispense:  16 g    Refill:  6    Order Specific Question:   Supervising Provider    Answer:   10-24-1990 Quentin Angst    Follow-up: Return for follow up as needed.    [id number], NP

## 2020-12-24 NOTE — Patient Instructions (Addendum)

## 2020-12-28 ENCOUNTER — Ambulatory Visit: Payer: Self-pay

## 2021-01-05 ENCOUNTER — Ambulatory Visit: Payer: 59 | Attending: Pain Medicine

## 2021-01-05 ENCOUNTER — Other Ambulatory Visit: Payer: Self-pay

## 2021-01-05 VITALS — BP 168/120 | HR 71

## 2021-01-05 DIAGNOSIS — M25611 Stiffness of right shoulder, not elsewhere classified: Secondary | ICD-10-CM | POA: Insufficient documentation

## 2021-01-05 DIAGNOSIS — R293 Abnormal posture: Secondary | ICD-10-CM | POA: Insufficient documentation

## 2021-01-05 DIAGNOSIS — M79601 Pain in right arm: Secondary | ICD-10-CM | POA: Insufficient documentation

## 2021-01-05 DIAGNOSIS — R29898 Other symptoms and signs involving the musculoskeletal system: Secondary | ICD-10-CM | POA: Insufficient documentation

## 2021-01-05 NOTE — Therapy (Signed)
[facility] [address] [place], [place], [address] Phone: [phone]   Fax:  [phone]  Physical Therapy Evaluation/FCE  Patient Details  Name: Jon Yates SHFWYOVZC MRN: [id number] Date of Birth: July 27, 1996 Referring Provider (PT): Delano Metz, MD   Encounter Date: 01/05/2021   PT End of Session - 01/05/21 0808    Visit Number 1    Number of Visits 1    PT Start Time 0808    PT Stop Time 0935    PT Time Calculation (min) 87 min    Activity Tolerance Patient tolerated treatment well    Behavior During Therapy [facility] for tasks assessed/performed           History reviewed. No pertinent past medical history.  History reviewed. No pertinent surgical history.  Vitals:   01/05/21 1028  BP: (!) 168/120  Pulse: 71      Subjective Assessment - 01/05/21 0827    Subjective He reports he is here for RT hand and ram weakness. Makes doing work difficult with lifting.    This started  at birth. In [place] he was not doing heavy work but now in [place] and is using arms for work.  Carrying  and lifting he feels he is using his LT arm more.    Patient is accompained by: Interpreter    Limitations Lifting              [facility] PT Assessment - 01/05/21 0001      Assessment   Medical Diagnosis RT arm pain, weakness    Referring Provider (PT) Delano Metz, MD    Onset Date/Surgical Date --   he reports he has always had issues with RT arm snce he was young   Hand Dominance Left    Next MD Visit None    Prior Therapy No      Precautions   Precautions None      Restrictions   Weight Bearing Restrictions No      Balance Screen   Has the patient fallen in the past 6 months No      Prior Function   Level of Independence Independent      Cognition   Overall Cognitive Status Within Functional Limits for tasks assessed      Coordination   Gross Motor Movements are Fluid and Coordinated --   Decreased RT hand and  fingers thumb to finers and rapid thumb to finger tapping     Posture/Postural Control   Posture Comments Rounde shoulders and flexed spine and stiffness noted and inability to perform full thoracic extension      ROM / Strength   AROM / PROM / Strength AROM;PROM;Strength      AROM   Overall AROM Comments He has normal ROM in  LT wrist flex 70  ext  52. RT wrist flexion 65  extension 52    AROM Assessment Site Shoulder;Elbow;Forearm    Right/Left Shoulder Right;Left    Right Shoulder Extension 10 Degrees    Right Shoulder Flexion 100 Degrees    Right Shoulder ABduction 90 Degrees    Right Shoulder Internal Rotation 10 Degrees    Right Shoulder External Rotation 50 Degrees    Right Shoulder Horizontal ABduction -30 Degrees    Right Shoulder Horizontal  ADduction 90 Degrees    Left Shoulder Extension 30 Degrees    Left Shoulder Flexion 145 Degrees    Left Shoulder ABduction 140 Degrees    Left Shoulder Internal Rotation 65  Degrees    Left Shoulder External Rotation 90 Degrees    Left Shoulder Horizontal ABduction 5 Degrees    Left Shoulder Horizontal ADduction 100 Degrees    Right/Left Elbow Left;Right    Right Elbow Flexion 130    Right Elbow Extension -28    Left Elbow Flexion 140    Left Elbow Extension 180      PROM   PROM Assessment Site Shoulder;Elbow;Wrist    Right/Left Shoulder Right    Right Shoulder Extension 15 Degrees    Right Shoulder Flexion 108 Degrees    Right Shoulder ABduction 90 Degrees    Right Shoulder Internal Rotation 50 Degrees    Right Shoulder External Rotation 50 Degrees    Right Shoulder Horizontal ABduction -25 Degrees    Right Shoulder Horizontal  ADduction 95 Degrees    Right/Left Elbow Right    Right Elbow Flexion 140    Right Elbow Extension -25    Right/Left Wrist Right    Right Wrist Flexion 80 Degrees    Right Wrist Radial Deviation 55 Degrees      Strength   Overall Strength Comments LT UE WNL    RT thumb 4/5, finger abduction 4/5      RT scapula retraction 3/5     When scapula is stabilized his strength is better but when the RT scapula needs to stabilize the shoulder it is not abel and so shoulkder and elbow score are worse in space.    Strength Assessment Site Shoulder;Elbow;Wrist;Hand    Right/Left Shoulder Right    Right Shoulder Flexion 4+/5    Right Shoulder Extension 3-/5    Right Shoulder ABduction 3-/5    Right Shoulder Internal Rotation 4+/5    Right Shoulder External Rotation 4+/5    Right Shoulder Horizontal ABduction 2-/5    Right Shoulder Horizontal ADduction 4+/5    Right/Left Elbow Right    Right Elbow Flexion 5/5    Right Elbow Extension 5/5    Right/Left Wrist Right    Right Wrist Flexion 5/5    Right Wrist Extension 5/5    Right/Left hand Right;Left    Right Hand Gross Grasp Functional    Right Hand Grip (lbs) 83    Right Hand Lateral Pinch 20 lbs    Left Hand Gross Grasp Impaired    Left Hand Grip (lbs) 39    Left Hand Lateral Pinch 24 lbs      Palpation   Spinal mobility stiffness of thoracic spine      Ambulation/Gait   Ambulation/Gait No    Gait Comments Normal                      Objective measurements completed on examination: See above findings.               PT Education - 01/05/21 6389    Education Details REsults of MMT and ROM . Need to return to MD with BP concerns and possible limitation note for work.    Person(s) Educated Patient    Methods Explanation    Comprehension Verbalized understanding                       Plan - 01/05/21 0810    Clinical Impression Statement Mr Shober  was not able to complete the FCE testing as his blood pressure was elevated to a level above the (150/100 or less at rest)   level  for participating in  the FCE per [employer] protocols. See the strength/ROM/ posture measurements in the assessment.   Over the time period from the beginning to the end of the assessment Mr Herrada's BP  was always  above the protocol level with the final measure after sitting quiet for 5 min was 173/125.  He reported he had a death in family recently and had never had anything but normal BP readings in past.    I informed him that I could only go by his BP today to make a decision about proceeding or stopping the FCE.   A permanent disability rating could not be determined  even with the FCE information as he has not had any formal PT or OT to see if he can inprove his ROM and strength and subsequently his physical performance for work. Hopefully the  imparement assessment  for strength and ROM will help with his job placement and possible restrictions.  If his BP is WNL and you feel he needs the formal FCE please reorder after you see him.  I will still work under the [employer] protocol but hopefully his BP will improve. Due to limitations of RT shoulder ROM and poor scapula stabilization activity over shoulder height may put excessive strain on The clients RT shoulder.    PT Frequency One time visit    PT Next Visit Plan A note was sent to MD. No follow up unless MD asks for another attempt at FCE    Recommended Other Services Formal PT or OT to work on ROM /posture and strength of RT arm    Consulted and Agree with Plan of Care Patient           Patient will benefit from skilled therapeutic intervention in order to improve the following deficits and impairments:     Visit Diagnosis: Pain in right arm  Right arm weakness  Stiffness of right shoulder, not elsewhere classified  Abnormal posture     Problem List Patient Active Problem List   Diagnosis Date Noted  . Acute on chronic respiratory failure with hypoxia (HCC) 03/16/2019  . SIRS (systemic inflammatory response syndrome) (HCC) 03/13/2019  . Acute respiratory disease due to COVID-19 virus 03/13/2019    Caprice Red  PT  01/05/2021, 10:33 AM  [facility] [address] [place],  [place], [address] Phone: [phone]   Fax:  [phone]  Name: Marvion Bastidas [id number] MRN: [id number] Date of Birth: October 29, 1996

## 2021-01-10 ENCOUNTER — Encounter: Payer: Self-pay | Admitting: Nurse Practitioner

## 2021-01-10 ENCOUNTER — Other Ambulatory Visit: Payer: Self-pay

## 2021-01-10 ENCOUNTER — Ambulatory Visit (INDEPENDENT_AMBULATORY_CARE_PROVIDER_SITE_OTHER): Payer: 59 | Admitting: Nurse Practitioner

## 2021-01-10 VITALS — BP 142/101 | HR 85 | Temp 97.5°F | Ht 67.0 in | Wt 224.6 lb

## 2021-01-10 DIAGNOSIS — R030 Elevated blood-pressure reading, without diagnosis of hypertension: Secondary | ICD-10-CM

## 2021-01-10 DIAGNOSIS — Z5320 Procedure and treatment not carried out because of patient's decision for unspecified reasons: Secondary | ICD-10-CM

## 2021-01-10 DIAGNOSIS — Z5329 Procedure and treatment not carried out because of patient's decision for other reasons: Secondary | ICD-10-CM | POA: Diagnosis not present

## 2021-01-10 DIAGNOSIS — E669 Obesity, unspecified: Secondary | ICD-10-CM | POA: Diagnosis not present

## 2021-01-10 NOTE — Patient Instructions (Addendum)
Managing Your Hypertension Hypertension, also called high blood pressure, is when the force of the blood pressing against the walls of the arteries is too strong. Arteries are blood vessels that carry blood from your heart throughout your body. Hypertension forces the heart to work harder to pump blood and may cause the arteries to become narrow or stiff. Understanding blood pressure readings Your personal target blood pressure may vary depending on your medical conditions, your age, and other factors. A blood pressure reading includes a higher number over a lower number. Ideally, your blood pressure should be below 120/80. You should know that:  The first, or top, number is called the systolic pressure. It is a measure of the pressure in your arteries as your heart beats.  The second, or bottom number, is called the diastolic pressure. It is a measure of the pressure in your arteries as the heart relaxes. Blood pressure is classified into four stages. Based on your blood pressure reading, your health care provider may use the following stages to determine what type of treatment you need, if any. Systolic pressure and diastolic pressure are measured in a unit called mmHg. Normal  Systolic pressure: below 120.  Diastolic pressure: below 80. Elevated  Systolic pressure: 120-129.  Diastolic pressure: below 80. Hypertension stage 1  Systolic pressure: 130-139.  Diastolic pressure: 80-89. Hypertension stage 2  Systolic pressure: 140 or above.  Diastolic pressure: 90 or above. How can this condition affect me? Managing your hypertension is an important responsibility. Over time, hypertension can damage the arteries and decrease blood flow to important parts of the body, including the brain, heart, and kidneys. Having untreated or uncontrolled hypertension can lead to:  A heart attack.  A stroke.  A weakened blood vessel (aneurysm).  Heart failure.  Kidney damage.  Eye  damage.  Metabolic syndrome.  Memory and concentration problems.  Vascular dementia. What actions can I take to manage this condition? Hypertension can be managed by making lifestyle changes and possibly by taking medicines. Your health care provider will help you make a plan to bring your blood pressure within a normal range. Nutrition  Eat a diet that is high in fiber and potassium, and low in salt (sodium), added sugar, and fat. An example eating plan is called the Dietary Approaches to Stop Hypertension (DASH) diet. To eat this way: ? Eat plenty of fresh fruits and vegetables. Try to fill one-half of your plate at each meal with fruits and vegetables. ? Eat whole grains, such as whole-wheat pasta, brown rice, or whole-grain bread. Fill about one-fourth of your plate with whole grains. ? Eat low-fat dairy products. ? Avoid fatty cuts of meat, processed or cured meats, and poultry with skin. Fill about one-fourth of your plate with lean proteins such as fish, chicken without skin, beans, eggs, and tofu. ? Avoid pre-made and processed foods. These tend to be higher in sodium, added sugar, and fat.  Reduce your daily sodium intake. Most people with hypertension should eat less than 1,500 mg of sodium a day.   Lifestyle  Work with your health care provider to maintain a healthy body weight or to lose weight. Ask what an ideal weight is for you.  Get at least 30 minutes of exercise that causes your heart to beat faster (aerobic exercise) most days of the week. Activities may include walking, swimming, or biking.  Include exercise to strengthen your muscles (resistance exercise), such as weight lifting, as part of your weekly exercise routine. Try   to do these types of exercises for 30 minutes at least 3 days a week.  Do not use any products that contain nicotine or tobacco, such as cigarettes, e-cigarettes, and chewing tobacco. If you need help quitting, ask your health care  provider.  Control any long-term (chronic) conditions you have, such as high cholesterol or diabetes.  Identify your sources of stress and find ways to manage stress. This may include meditation, deep breathing, or making time for fun activities.   Alcohol use  Do not drink alcohol if: ? Your health care provider tells you not to drink. ? You are pregnant, may be pregnant, or are planning to become pregnant.  If you drink alcohol: ? Limit how much you use to:  0-1 drink a day for women.  0-2 drinks a day for men. ? Be aware of how much alcohol is in your drink. In the [place] one drink equals one 12 oz bottle of beer (355 mL), one 5 oz glass of wine (148 mL), or one 1 oz glass of hard liquor (44 mL). Medicines Your health care provider may prescribe medicine if lifestyle changes are not enough to get your blood pressure under control and if:  Your systolic blood pressure is 130 or higher.  Your diastolic blood pressure is 80 or higher. Take medicines only as told by your health care provider. Follow the directions carefully. Blood pressure medicines must be taken as told by your health care provider. The medicine does not work as well when you skip doses. Skipping doses also puts you at risk for problems. Monitoring Before you monitor your blood pressure:  Do not smoke, drink caffeinated beverages, or exercise within 30 minutes before taking a measurement.  Use the bathroom and empty your bladder (urinate).  Sit quietly for at least 5 minutes before taking measurements. Monitor your blood pressure at home as told by your health care provider. To do this:  Sit with your back straight and supported.  Place your feet flat on the floor. Do not cross your legs.  Support your arm on a flat surface, such as a table. Make sure your upper arm is at heart level.  Each time you measure, take two or three readings one minute apart and record the results. You may also need to have your  blood pressure checked regularly by your health care provider.   General information  Talk with your health care provider about your diet, exercise habits, and other lifestyle factors that may be contributing to hypertension.  Review all the medicines you take with your health care provider because there may be side effects or interactions.  Keep all visits as told by your health care provider. Your health care provider can help you create and adjust your plan for managing your high blood pressure. Where to find more information  National Heart, Lung, and Blood Institute: www.nhlbi.nih.gov  American Heart Association: www.heart.org Contact a health care provider if:  You think you are having a reaction to medicines you have taken.  You have repeated (recurrent) headaches.  You feel dizzy.  You have swelling in your ankles.  You have trouble with your vision. Get help right away if:  You develop a severe headache or confusion.  You have unusual weakness or numbness, or you feel faint.  You have severe pain in your chest or abdomen.  You vomit repeatedly.  You have trouble breathing. These symptoms may represent a serious problem that is an emergency. Do not wait   to see if the symptoms will go away. Get medical help right away. Call your local emergency services (911 in the [place] Do not drive yourself to the hospital. Summary  Hypertension is when the force of blood pumping through your arteries is too strong. If this condition is not controlled, it may put you at risk for serious complications.  Your personal target blood pressure may vary depending on your medical conditions, your age, and other factors. For most people, a normal blood pressure is less than 120/80.  Hypertension is managed by lifestyle changes, medicines, or both.  Lifestyle changes to help manage hypertension include losing weight, eating a healthy, low-sodium diet, exercising more, stopping smoking, and  limiting alcohol. This information is not intended to replace advice given to you by your health care provider. Make sure you discuss any questions you have with your health care provider. Document Revised: 12/16/2019 Document Reviewed: 10/11/2019 Elsevier Patient Education  2021 [employer]  Complicated Grief Grief is a normal response to the death of someone close to you. Feelings of fear, anger, and guilt can affect almost everyone who loses a loved one. It is also common to have symptoms of depression while you are grieving. These include problems with sleep, loss of appetite, and lack of energy. They may last for weeks or months after a loss. Complicated grief is different from normal grief or depression. Normal grieving involves sadness and feelings of loss, but those feelings get better and heal over time. Complicated grief is a severe type of grief that lasts for a long time, usually for several months to a year or longer. It interferes with your ability to function normally. Complicated grief may require treatment from a mental health care provider. What are the causes? The cause of this condition is not known. It is not clear why some people continue to struggle with grief and others do not. What increases the risk? You are more likely to develop this condition if:  The death of your loved one was sudden or unexpected.  The death of your loved one was due to a violent event.  Your loved one died from suicide.  Your loved one was a child or a young person.  You were very close to your loved one, or you were dependent on him or her.  You have a history of depression or anxiety. What are the signs or symptoms? Symptoms of this condition include:  Feeling disbelief or having a lack of emotion (numbness).  Being unable to enjoy good memories of your loved one.  Needing to avoid anything or anyone that reminds you of your loved one.  Being unable to stop thinking about the  death.  Feeling intense anger or guilt.  Feeling alone and hopeless.  Feeling that your life is meaningless and empty.  Losing the desire to move on with your life. How is this diagnosed? This condition may be diagnosed based on:  Your symptoms. Complicated grief will be diagnosed if you have ongoing symptoms of grief for 6-12 months or longer.  The effect of symptoms on your life. You may be diagnosed with this condition if your symptoms are interfering with your ability to live your life. Your health care provider may recommend that you see a mental health care provider. Many symptoms of depression are similar to the symptoms of complicated grief. It is important to be evaluated for complicated grief along with other mental health conditions. How is this treated? This condition is most  commonly treated with talk therapy. This therapy is offered by a mental health specialist (psychiatrist). During therapy:  You will learn healthy ways to cope with the loss of your loved one.  Your mental health care provider may recommend antidepressant medicines.   Follow these instructions at home: Lifestyle  Take care of yourself. ? Eat on a regular basis, and maintain a healthy diet. Eat plenty of fruits, vegetables, lean protein, and whole grains. ? Try to get some exercise each day. Aim for 30 minutes of exercise on most days of the week. ? Keep a consistent sleep schedule. Try to get 8 or more hours of sleep each night. ? Start doing the things that you used to enjoy.  Do not use drugs or alcohol to ease your symptoms.  Spend time with friends and loved ones.   General instructions  Take over-the-counter and prescription medicines only as told by your health care provider.  Consider joining a grief (bereavement) support group to help you deal with your loss.  Keep all follow-up visits as told by your health care provider. This is important. Contact a health care provider if:  Your  symptoms prevent you from functioning normally.  Your symptoms do not get better with treatment. Get help right away if:  You have serious thoughts about hurting yourself or someone else.  You have suicidal feelings. If you ever feel like you may hurt yourself or others, or have thoughts about taking your own life, get help right away. You can go to your nearest emergency department or call:  Your local emergency services (911 in the [place]  A suicide crisis helpline, such as the National Suicide Prevention Lifeline at 734-256-8704. This is open 24 hours a day. Summary  Complicated grief is a severe type of grief that lasts for a long time. This grief is not likely to go away on its own. Get the help you need.  Some griefs are more difficult than others and can cause this condition. You may need a certain type of treatment to help you recover if the loss of your loved one was sudden, violent, or due to suicide.  You may feel guilty about moving on with your life. Getting help does not mean that you are forgetting your loved one. It means that you are taking care of yourself.  Complicated grief is best treated with talk therapy. Medicines may also be prescribed.  Seek the help you need, and find support that will help you recover. This information is not intended to replace advice given to you by your health care provider. Make sure you discuss any questions you have with your health care provider. Document Revised: 05/03/2020 Document Reviewed: 05/03/2020 Elsevier Patient Education  2021 [employer].

## 2021-01-10 NOTE — Progress Notes (Signed)
[facility] Patient [facility] [address] [place], [place]  [address] Phone:  [phone]   Fax:  [phone]    Established Patient Office Visit  Subjective:  Patient ID: Jon Yates, male    DOB: 05/23/1996  Age: 25 y.o. MRN: [id number]  CC:  Chief Complaint  Patient presents with  . discuss results     Discuss  results and concerns regarding blood pressure     HPI Jon Yates presents for elevated BP.  He  has no past medical history on file.   He was to have an evaluation at PT for his upper extremities related to his job. He was unable to complete testing due elevated BP. His admits that his brother passed unexpectedly after a one week illness in [place].   Hypertension Patient is here for evaluation of elevated blood pressures. Cardiac symptoms: none. Patient denies chest pain, chest pressure/discomfort, claudication, dyspnea, exertional chest pressure/discomfort, fatigue, irregular heart beat, lower extremity edema, near-syncope, orthopnea, palpitations, paroxysmal nocturnal dyspnea, syncope and tachypnea. Cardiovascular risk factors: male gender and obesity (BMI >= 30 kg/m2). Use of agents associated with hypertension: NSAIDS. History of target organ damage: none. He admits that on last week he was having some increased symptoms with the death of his brother but denies any now. He had been doing well with weight loss. He has lost one additional pound in the last few weeks. He feels like he is 90% back to himself. He will not be able to travel home for the funeral. He does not want to start treatment for hypertension at this time. He feels that it is stress related.     History reviewed. No pertinent past medical history.  History reviewed. No pertinent surgical history.  History reviewed. No pertinent family history.  Social History   Socioeconomic History  . Marital status: Single    Spouse name: Not on file  . Number of children: Not on file  .  Years of education: Not on file  . Highest education level: Not on file  Occupational History  . Not on file  Tobacco Use  . Smoking status: Never Smoker  . Smokeless tobacco: Never Used  Substance and Sexual Activity  . Alcohol use: Yes    Comment: 3 beers a month   . Drug use: Never  . Sexual activity: Not Currently  Other Topics Concern  . Not on file  Social History Narrative  . Not on file   Social Determinants of Health   Financial Resource Strain: Not on file  Food Insecurity: Not on file  Transportation Needs: Not on file  Physical Activity: Not on file  Stress: Not on file  Social Connections: Not on file  Intimate Partner Violence: Not on file    Outpatient Medications Prior to Visit  Medication Sig Dispense Refill  . fluticasone (FLONASE) 50 MCG/ACT nasal spray Place 2 sprays into both nostrils daily. 16 g 6  . lidocaine (LIDODERM) 5 % Place 1 patch onto the skin daily. Remove & Discard patch within 12 hours or as directed by MD 30 patch 0  . naproxen (NAPROSYN) 375 MG tablet Take 1 tablet (375 mg total) by mouth 2 (two) times daily. 20 tablet 0   No facility-administered medications prior to visit.    No Known Allergies  ROS Review of Systems  Respiratory: Negative for shortness of breath.   Cardiovascular: Negative for chest pain.  Gastrointestinal: Negative for nausea.  Neurological: Negative for dizziness, light-headedness  and headaches.      Objective:    Physical Exam Constitutional:      General: He is not in acute distress.    Appearance: He is obese.  HENT:     Head: Normocephalic and atraumatic.     Nose: Nose normal.     Mouth/Throat:     Mouth: Mucous membranes are moist.  Cardiovascular:     Rate and Rhythm: Normal rate and regular rhythm.     Pulses: Normal pulses.     Heart sounds: Normal heart sounds.  Pulmonary:     Effort: Pulmonary effort is normal.     Breath sounds: Normal breath sounds.  Musculoskeletal:     Cervical  back: Normal range of motion.     Right lower leg: No edema.     Left lower leg: No edema.     Comments: discoloration in BLE related to varicose veins  Skin:    General: Skin is warm.     Capillary Refill: Capillary refill takes less than 2 seconds.  Neurological:     General: No focal deficit present.     Mental Status: He is alert and oriented to person, place, and time.  Psychiatric:        Mood and Affect: Mood normal.        Behavior: Behavior normal.        Thought Content: Thought content normal.        Judgment: Judgment normal.     BP (!) 142/101 (BP Location: Left Arm, Patient Position: Sitting, Cuff Size: Large)   Pulse 85   Temp (!) 97.5 F (36.4 C) (Temporal)   Ht 5\' 7"  (1.702 m)   Wt 224 lb 9.6 oz (101.9 kg)   SpO2 95%   BMI 35.18 kg/m  Wt Readings from Last 3 Encounters:  01/10/21 224 lb 9.6 oz (101.9 kg)  12/24/20 225 lb (102.1 kg)  05/02/20 225 lb 11.2 oz (102.4 kg)     Health Maintenance Due  Topic Date Due  . COVID-19 Vaccine (1) Never done  . TETANUS/TDAP  Never done  . INFLUENZA VACCINE  06/24/2020    There are no preventive care reminders to display for this patient.  No results found for: TSH Lab Results  Component Value Date   WBC 4.7 05/11/2020   HGB 14.0 05/11/2020   HCT 43.6 05/11/2020   MCV 90.1 05/11/2020   PLT 225 05/11/2020   Lab Results  Component Value Date   NA 137 05/11/2020   K 4.2 05/11/2020   CO2 27 05/11/2020   GLUCOSE 97 05/11/2020   BUN 8 05/11/2020   CREATININE 0.84 05/11/2020   BILITOT 0.4 04/15/2019   ALKPHOS 72 04/15/2019   AST 51 (H) 04/15/2019   ALT 89 (H) 04/15/2019   PROT 7.3 04/15/2019   ALBUMIN 4.7 04/15/2019   CALCIUM 9.2 05/11/2020   ANIONGAP 8 05/11/2020   No results found for: CHOL No results found for: HDL No results found for: [facility] Lab Results  Component Value Date   TRIG 134 03/13/2019   No results found for: CHOLHDL No results found for: 03/15/2019    Assessment & Plan:    Problem List Items Addressed This Visit   None   Visit Diagnoses    Elevated BP without diagnosis of hypertension    -  Primary Declined medical treatment at this time with medication to control BP. Discharge home form completed to leave without treatment against medical recommendation Encouraged home monitoring and  recording BP <130/80 Eating a heart-healthy diet with less salt Encouraged regular physical activity  Recommend Weight loss   Obesity (BMI 35.0-39.9 without comorbidity)      Hypertension treatment declined Will monitor BP at home and follow up if remains uncontrolled follow up in 3 weeks    No orders of the defined types were placed in this encounter.   Follow-up: Return in about 3 weeks (around 01/31/2021) for Follow up HTN [id number].    Barbette Merino, NP

## 2021-01-17 ENCOUNTER — Telehealth: Payer: Self-pay

## 2021-01-17 ENCOUNTER — Other Ambulatory Visit: Payer: Self-pay | Admitting: Nurse Practitioner

## 2021-01-17 DIAGNOSIS — Q752 Hypertelorism: Secondary | ICD-10-CM

## 2021-01-17 MED ORDER — AMLODIPINE BESYLATE 5 MG PO TABS: 5.0000 mg | ORAL_TABLET | Freq: Every day | ORAL | 11 refills | Status: DC

## 2021-01-17 MED FILL — AMLODIPINE BESYLATE 5 MG TA: 5 | 30 days supply | Qty: 30 | Fill #0

## 2021-01-17 NOTE — Telephone Encounter (Signed)
This has been sent and he has been notified via MyChart . He reads english well. Thanks.

## 2021-01-17 NOTE — Telephone Encounter (Signed)
Bp medicine refill but also to clarify please call (language barrier)

## 2021-01-31 ENCOUNTER — Ambulatory Visit: Payer: 59 | Admitting: Nurse Practitioner

## 2021-02-08 ENCOUNTER — Encounter: Payer: Self-pay | Admitting: Nurse Practitioner

## 2021-02-08 ENCOUNTER — Ambulatory Visit (INDEPENDENT_AMBULATORY_CARE_PROVIDER_SITE_OTHER): Payer: 59 | Admitting: Nurse Practitioner

## 2021-02-08 ENCOUNTER — Other Ambulatory Visit: Payer: Self-pay

## 2021-02-08 VITALS — BP 129/89 | HR 79 | Temp 97.9°F | Ht 67.0 in | Wt 227.0 lb

## 2021-02-08 DIAGNOSIS — I10 Essential (primary) hypertension: Secondary | ICD-10-CM | POA: Diagnosis not present

## 2021-02-08 DIAGNOSIS — I8393 Asymptomatic varicose veins of bilateral lower extremities: Secondary | ICD-10-CM | POA: Diagnosis not present

## 2021-02-08 NOTE — Patient Instructions (Signed)
Managing Your Hypertension Hypertension, also called high blood pressure, is when the force of the blood pressing against the walls of the arteries is too strong. Arteries are blood vessels that carry blood from your heart throughout your body. Hypertension forces the heart to work harder to pump blood and may cause the arteries to become narrow or stiff. Understanding blood pressure readings Your personal target blood pressure may vary depending on your medical conditions, your age, and other factors. A blood pressure reading includes a higher number over a lower number. Ideally, your blood pressure should be below 120/80. You should know that:  The first, or top, number is called the systolic pressure. It is a measure of the pressure in your arteries as your heart beats.  The second, or bottom number, is called the diastolic pressure. It is a measure of the pressure in your arteries as the heart relaxes. Blood pressure is classified into four stages. Based on your blood pressure reading, your health care provider may use the following stages to determine what type of treatment you need, if any. Systolic pressure and diastolic pressure are measured in a unit called mmHg. Normal  Systolic pressure: below 120.  Diastolic pressure: below 80. Elevated  Systolic pressure: 120-129.  Diastolic pressure: below 80. Hypertension stage 1  Systolic pressure: 130-139.  Diastolic pressure: 80-89. Hypertension stage 2  Systolic pressure: 140 or above.  Diastolic pressure: 90 or above. How can this condition affect me? Managing your hypertension is an important responsibility. Over time, hypertension can damage the arteries and decrease blood flow to important parts of the body, including the brain, heart, and kidneys. Having untreated or uncontrolled hypertension can lead to:  A heart attack.  A stroke.  A weakened blood vessel (aneurysm).  Heart failure.  Kidney damage.  Eye  damage.  Metabolic syndrome.  Memory and concentration problems.  Vascular dementia. What actions can I take to manage this condition? Hypertension can be managed by making lifestyle changes and possibly by taking medicines. Your health care provider will help you make a plan to bring your blood pressure within a normal range. Nutrition  Eat a diet that is high in fiber and potassium, and low in salt (sodium), added sugar, and fat. An example eating plan is called the Dietary Approaches to Stop Hypertension (DASH) diet. To eat this way: ? Eat plenty of fresh fruits and vegetables. Try to fill one-half of your plate at each meal with fruits and vegetables. ? Eat whole grains, such as whole-wheat pasta, brown rice, or whole-grain bread. Fill about one-fourth of your plate with whole grains. ? Eat low-fat dairy products. ? Avoid fatty cuts of meat, processed or cured meats, and poultry with skin. Fill about one-fourth of your plate with lean proteins such as fish, chicken without skin, beans, eggs, and tofu. ? Avoid pre-made and processed foods. These tend to be higher in sodium, added sugar, and fat.  Reduce your daily sodium intake. Most people with hypertension should eat less than 1,500 mg of sodium a day.   Lifestyle  Work with your health care provider to maintain a healthy body weight or to lose weight. Ask what an ideal weight is for you.  Get at least 30 minutes of exercise that causes your heart to beat faster (aerobic exercise) most days of the week. Activities may include walking, swimming, or biking.  Include exercise to strengthen your muscles (resistance exercise), such as weight lifting, as part of your weekly exercise routine. Try   to do these types of exercises for 30 minutes at least 3 days a week.  Do not use any products that contain nicotine or tobacco, such as cigarettes, e-cigarettes, and chewing tobacco. If you need help quitting, ask your health care  provider.  Control any long-term (chronic) conditions you have, such as high cholesterol or diabetes.  Identify your sources of stress and find ways to manage stress. This may include meditation, deep breathing, or making time for fun activities.   Alcohol use  Do not drink alcohol if: ? Your health care provider tells you not to drink. ? You are pregnant, may be pregnant, or are planning to become pregnant.  If you drink alcohol: ? Limit how much you use to:  0-1 drink a day for women.  0-2 drinks a day for men. ? Be aware of how much alcohol is in your drink. In the [place] one drink equals one 12 oz bottle of beer (355 mL), one 5 oz glass of wine (148 mL), or one 1 oz glass of hard liquor (44 mL). Medicines Your health care provider may prescribe medicine if lifestyle changes are not enough to get your blood pressure under control and if:  Your systolic blood pressure is 130 or higher.  Your diastolic blood pressure is 80 or higher. Take medicines only as told by your health care provider. Follow the directions carefully. Blood pressure medicines must be taken as told by your health care provider. The medicine does not work as well when you skip doses. Skipping doses also puts you at risk for problems. Monitoring Before you monitor your blood pressure:  Do not smoke, drink caffeinated beverages, or exercise within 30 minutes before taking a measurement.  Use the bathroom and empty your bladder (urinate).  Sit quietly for at least 5 minutes before taking measurements. Monitor your blood pressure at home as told by your health care provider. To do this:  Sit with your back straight and supported.  Place your feet flat on the floor. Do not cross your legs.  Support your arm on a flat surface, such as a table. Make sure your upper arm is at heart level.  Each time you measure, take two or three readings one minute apart and record the results. You may also need to have your  blood pressure checked regularly by your health care provider.   General information  Talk with your health care provider about your diet, exercise habits, and other lifestyle factors that may be contributing to hypertension.  Review all the medicines you take with your health care provider because there may be side effects or interactions.  Keep all visits as told by your health care provider. Your health care provider can help you create and adjust your plan for managing your high blood pressure. Where to find more information  National Heart, Lung, and Blood Institute: www.nhlbi.nih.gov  American Heart Association: www.heart.org Contact a health care provider if:  You think you are having a reaction to medicines you have taken.  You have repeated (recurrent) headaches.  You feel dizzy.  You have swelling in your ankles.  You have trouble with your vision. Get help right away if:  You develop a severe headache or confusion.  You have unusual weakness or numbness, or you feel faint.  You have severe pain in your chest or abdomen.  You vomit repeatedly.  You have trouble breathing. These symptoms may represent a serious problem that is an emergency. Do not wait   to see if the symptoms will go away. Get medical help right away. Call your local emergency services (911 in the [place] Do not drive yourself to the hospital. Summary  Hypertension is when the force of blood pumping through your arteries is too strong. If this condition is not controlled, it may put you at risk for serious complications.  Your personal target blood pressure may vary depending on your medical conditions, your age, and other factors. For most people, a normal blood pressure is less than 120/80.  Hypertension is managed by lifestyle changes, medicines, or both.  Lifestyle changes to help manage hypertension include losing weight, eating a healthy, low-sodium diet, exercising more, stopping smoking, and  limiting alcohol. This information is not intended to replace advice given to you by your health care provider. Make sure you discuss any questions you have with your health care provider. Document Revised: 12/16/2019 Document Reviewed: 10/11/2019 Elsevier Patient Education  2021 Elsevier Inc.  

## 2021-02-08 NOTE — Progress Notes (Signed)
[facility] Patient [facility] [address] [place], [place]  [address] Phone:  [phone]   Fax:  [phone]    Established Patient Office Visit  Subjective:  Patient ID: Jon Yates, male    DOB: Feb 01, 1996  Age: 25 y.o. MRN: [id number]  CC:  Chief Complaint  Patient presents with  . Follow-up    Follow up  blood pressure , taking medicine , has monitor with him      HPI Klein Willcox [id number] presents for follow up. He  has no past medical history on file.   Hypertension Patient is here for follow-up of elevated blood pressure. He is exercising and is adherent to a low-salt diet. Blood pressure is well controlled at home. Cardiac symptoms: none. Patient denies chest pain, dyspnea, fatigue, irregular heart beat, lower extremity edema, near-syncope, orthopnea and palpitations. Cardiovascular risk factors: hypertension, male gender and obesity (BMI >= 30 kg/m2). Use of agents associated with hypertension: none. History of target organ damage: none.  History reviewed. No pertinent past medical history.  History reviewed. No pertinent surgical history.  History reviewed. No pertinent family history.  Social History   Socioeconomic History  . Marital status: Single    Spouse name: Not on file  . Number of children: Not on file  . Years of education: Not on file  . Highest education level: Not on file  Occupational History  . Not on file  Tobacco Use  . Smoking status: Never Smoker  . Smokeless tobacco: Never Used  Substance and Sexual Activity  . Alcohol use: Yes    Comment: 3 beers a month   . Drug use: Never  . Sexual activity: Not Currently  Other Topics Concern  . Not on file  Social History Narrative  . Not on file   Social Determinants of Health   Financial Resource Strain: Not on file  Food Insecurity: Not on file  Transportation Needs: Not on file  Physical Activity: Not on file  Stress: Not on file  Social Connections: Not on file   Intimate Partner Violence: Not on file    Outpatient Medications Prior to Visit  Medication Sig Dispense Refill  . amLODipine (NORVASC) 5 MG tablet Take 1 tablet (5 mg total) by mouth daily. 30 tablet 11  . fluticasone (FLONASE) 50 MCG/ACT nasal spray Place 2 sprays into both nostrils daily. 16 g 6  . lidocaine (LIDODERM) 5 % Place 1 patch onto the skin daily. Remove & Discard patch within 12 hours or as directed by MD 30 patch 0  . naproxen (NAPROSYN) 375 MG tablet Take 1 tablet (375 mg total) by mouth 2 (two) times daily. 20 tablet 0   No facility-administered medications prior to visit.    No Known Allergies  ROS Review of Systems  Respiratory: Negative for shortness of breath.   Cardiovascular: Negative for chest pain and leg swelling.  Neurological: Negative for dizziness and headaches.      Objective:    Physical Exam Constitutional:      Appearance: He is obese.  HENT:     Head: Normocephalic and atraumatic.  Cardiovascular:     Rate and Rhythm: Normal rate and regular rhythm.     Pulses: Normal pulses.     Heart sounds: Normal heart sounds.  Pulmonary:     Effort: Pulmonary effort is normal.     Breath sounds: Normal breath sounds.  Musculoskeletal:     Cervical back: Normal range of motion.  Right lower leg: No edema.     Left lower leg: No edema.     Comments: Varicose veins in both lower extremities no change  Skin:    General: Skin is warm and dry.     Capillary Refill: Capillary refill takes less than 2 seconds.  Neurological:     General: No focal deficit present.     Mental Status: He is alert and oriented to person, place, and time.  Psychiatric:        Mood and Affect: Mood normal.        Behavior: Behavior normal.        Thought Content: Thought content normal.        Judgment: Judgment normal.     BP 129/89 (BP Location: Left Arm, Patient Position: Sitting, Cuff Size: Normal)   Pulse 79   Temp 97.9 F (36.6 C) (Temporal)   Ht 5\' 7"   (1.702 m)   Wt 227 lb (103 kg)   BMI 35.55 kg/m  Wt Readings from Last 3 Encounters:  02/08/21 227 lb (103 kg)  01/10/21 224 lb 9.6 oz (101.9 kg)  12/24/20 225 lb (102.1 kg)     Health Maintenance Due  Topic Date Due  . COVID-19 Vaccine (1) Never done  . HPV VACCINES (1 - Male 2-dose series) Never done       Topic Date Due  . HPV VACCINES (1 - Male 2-dose series) Never done    No results found for: TSH Lab Results  Component Value Date   WBC 4.7 05/11/2020   HGB 14.0 05/11/2020   HCT 43.6 05/11/2020   MCV 90.1 05/11/2020   PLT 225 05/11/2020   Lab Results  Component Value Date   NA 137 05/11/2020   K 4.2 05/11/2020   CO2 27 05/11/2020   GLUCOSE 97 05/11/2020   BUN 8 05/11/2020   CREATININE 0.84 05/11/2020   BILITOT 0.4 04/15/2019   ALKPHOS 72 04/15/2019   AST 51 (H) 04/15/2019   ALT 89 (H) 04/15/2019   PROT 7.3 04/15/2019   ALBUMIN 4.7 04/15/2019   CALCIUM 9.2 05/11/2020   ANIONGAP 8 05/11/2020   No results found for: CHOL No results found for: HDL No results found for: [facility] Lab Results  Component Value Date   TRIG 134 03/13/2019   No results found for: CHOLHDL No results found for: 03/15/2019    Assessment & Plan:   Problem List Items Addressed This Visit   None   Visit Diagnoses    Primary hypertension    -  Primary Improving on amlodipine Encouraged on going compliance with current medication regimen Encouraged home monitoring and recording BP <130/80 Eating a heart-healthy diet with less salt Encouraged regular physical activity  Recommend Weight loss   Relevant Orders   Comp. Metabolic Panel (12)   Asymptomatic varicose veins of both lower extremities    Stable no change      No orders of the defined types were placed in this encounter.   Follow-up: Return in about 3 months (around 05/11/2021).    05/13/2021, NP

## 2021-02-09 LAB — COMP. METABOLIC PANEL (12)
AST: 23 IU/L (ref 0–40)
Albumin/Globulin Ratio: 1.5 (ref 1.2–2.2)
Albumin: 4.1 g/dL (ref 4.1–5.2)
Alkaline Phosphatase: 74 IU/L (ref 44–121)
BUN/Creatinine Ratio: 14 (ref 9–20)
BUN: 10 mg/dL (ref 6–20)
Bilirubin Total: 0.8 mg/dL (ref 0.0–1.2)
Calcium: 9.1 mg/dL (ref 8.7–10.2)
Chloride: 103 mmol/L (ref 96–106)
Creatinine, Ser: 0.71 mg/dL — ABNORMAL LOW (ref 0.76–1.27)
Globulin, Total: 2.8 g/dL (ref 1.5–4.5)
Glucose: 92 mg/dL (ref 65–99)
Potassium: 3.9 mmol/L (ref 3.5–5.2)
Sodium: 142 mmol/L (ref 134–144)
Total Protein: 6.9 g/dL (ref 6.0–8.5)
eGFR: 131 mL/min/{1.73_m2} (ref >59)

## 2021-03-01 ENCOUNTER — Other Ambulatory Visit (HOSPITAL_COMMUNITY): Payer: Self-pay

## 2021-04-22 IMAGING — DX DG CHEST 2V
2 series · 2 of 2 positions shown · non-contrast
Comparison: 05/25/2019

CLINICAL DATA: Rib pain.

EXAM:
CHEST - 2 VIEW

[chest pa]
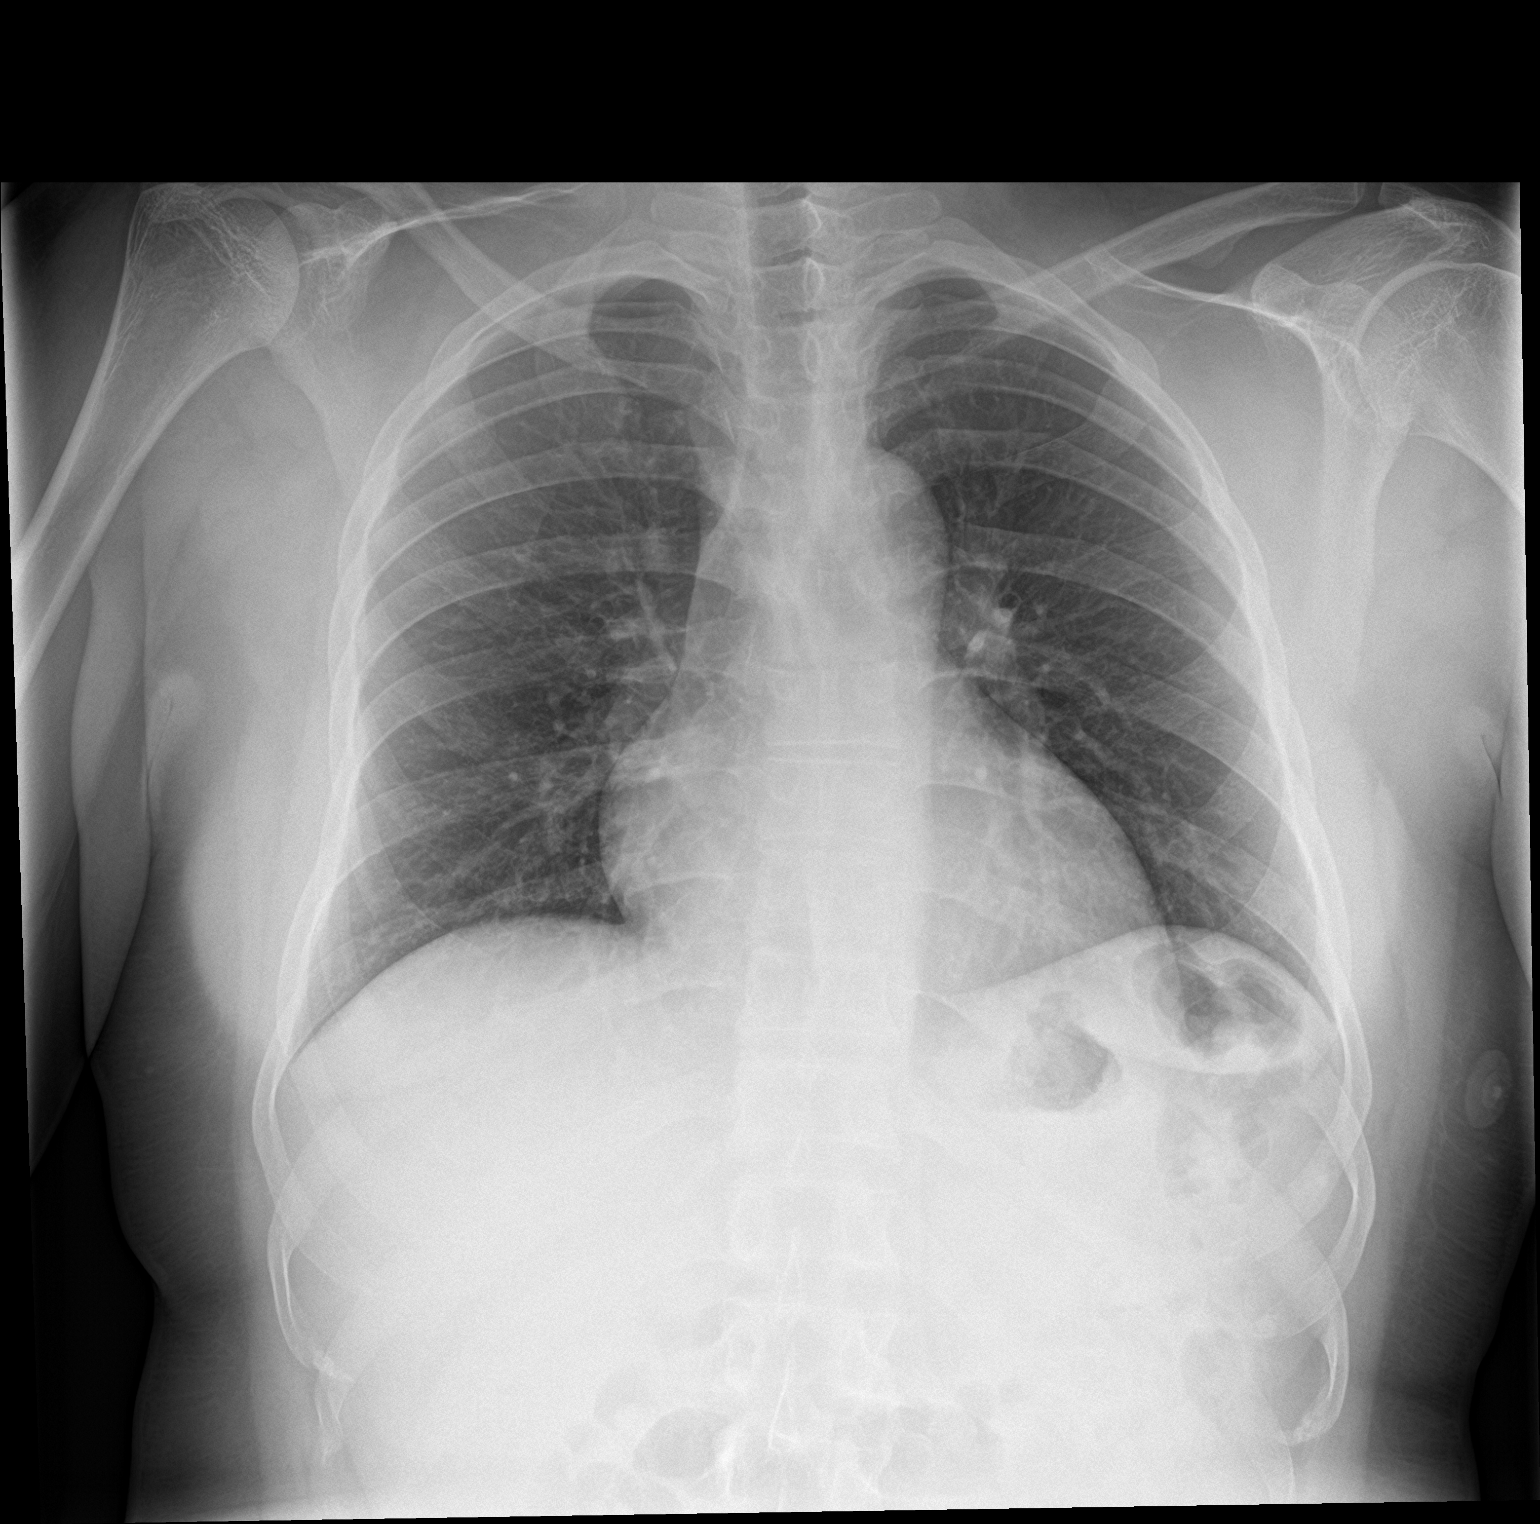

[chest lat]
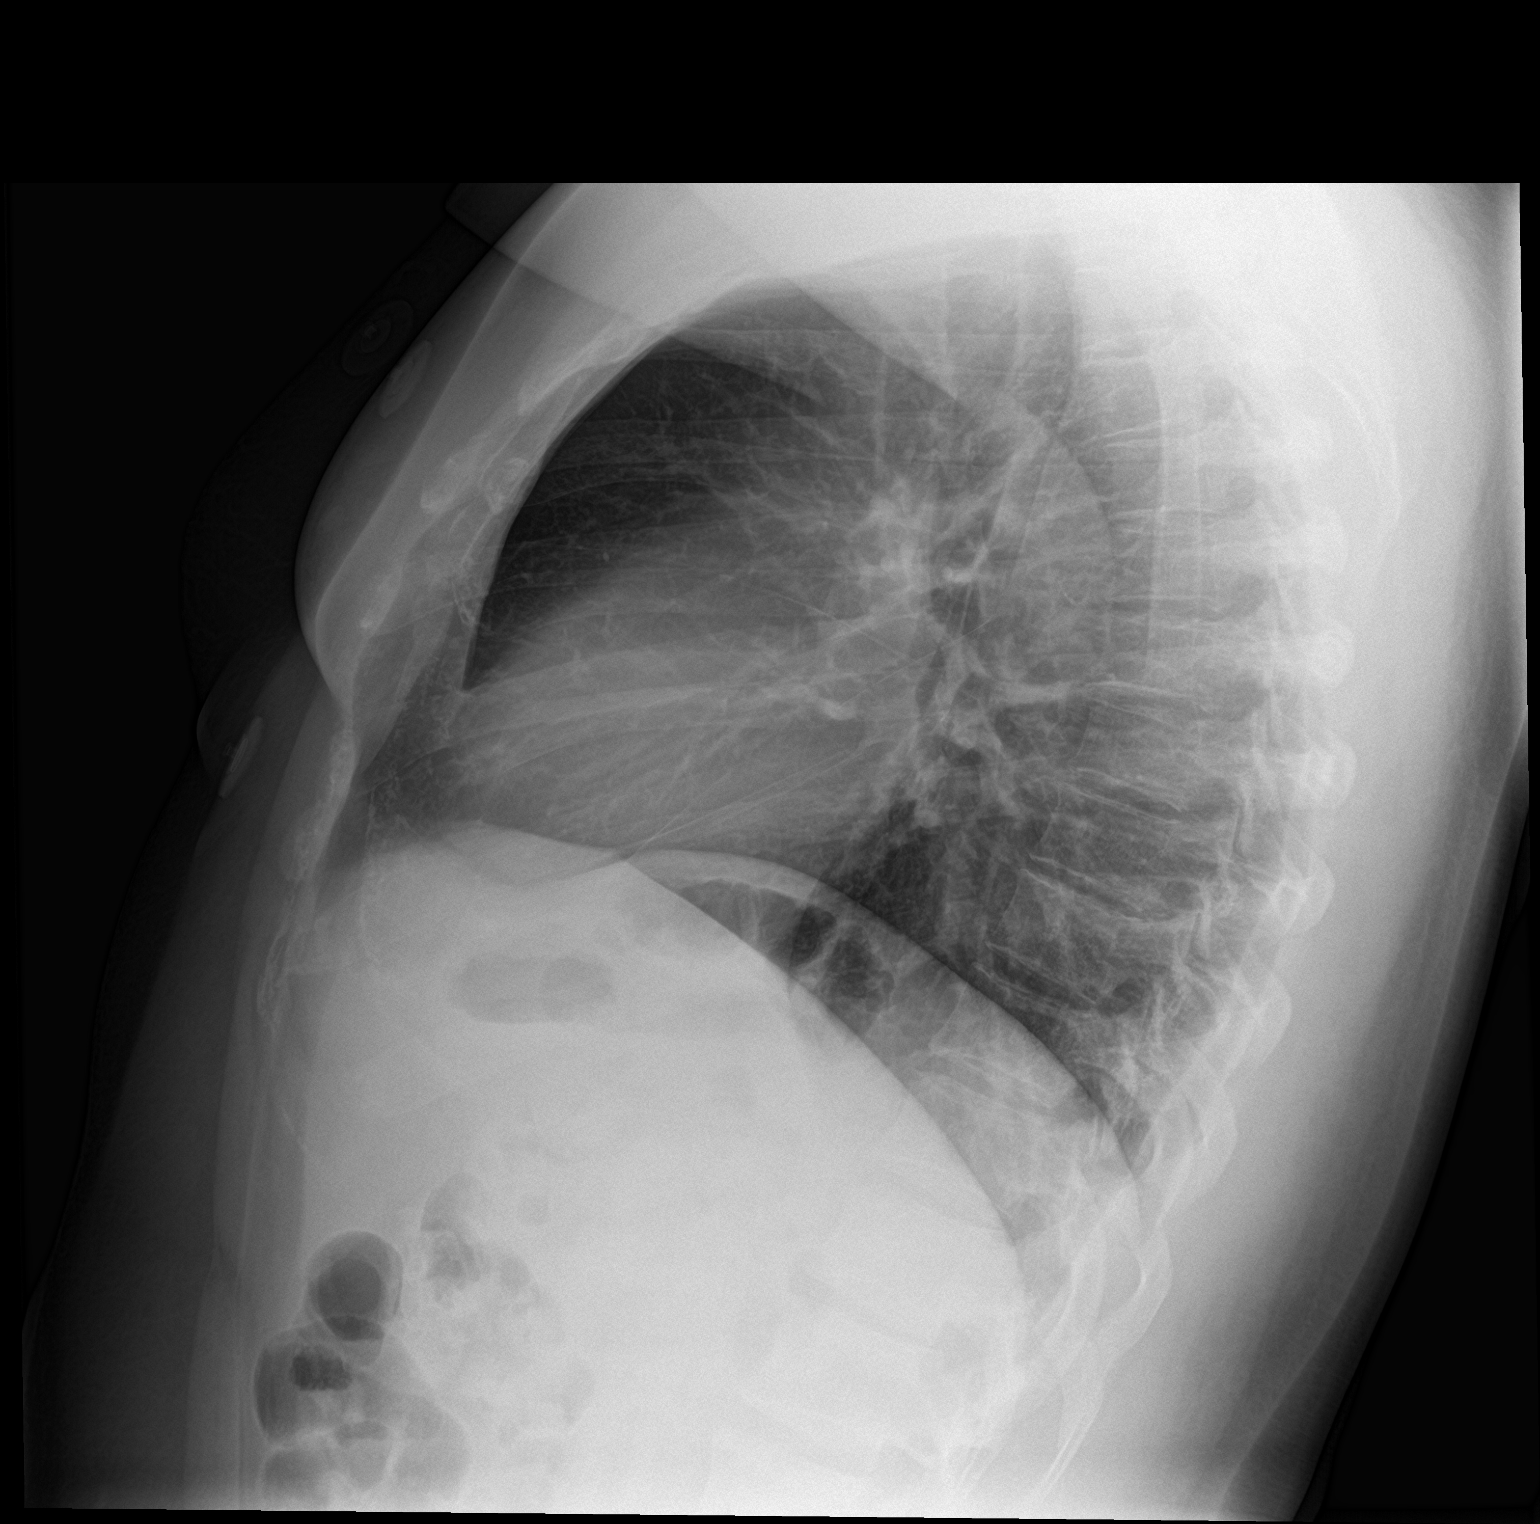

[2 of 2 positions shown; findings below may reference images not displayed]

FINDINGS: The heart size and mediastinal contours are within normal limits.
Both lungs are clear. The visualized skeletal structures are
unremarkable.
IMPRESSION: No active cardiopulmonary disease.

## 2021-04-23 ENCOUNTER — Telehealth: Payer: Self-pay

## 2021-04-23 NOTE — Telephone Encounter (Signed)
Med refill  Amlodipine  [facility]  [place]

## 2021-04-24 ENCOUNTER — Other Ambulatory Visit: Payer: Self-pay | Admitting: Nurse Practitioner

## 2021-04-24 MED ORDER — AMLODIPINE BESYLATE 5 MG PO TABS: ORAL_TABLET | Freq: Every day | ORAL | 11 refills | Status: DC

## 2021-04-24 NOTE — Telephone Encounter (Signed)
Medication refilled

## 2021-05-13 ENCOUNTER — Ambulatory Visit: Payer: 59 | Admitting: Nurse Practitioner

## 2022-02-03 ENCOUNTER — Telehealth: Payer: Self-pay

## 2022-02-03 ENCOUNTER — Other Ambulatory Visit: Payer: Self-pay | Admitting: Nurse Practitioner

## 2022-02-03 MED ORDER — AMLODIPINE BESYLATE 5 MG PO TABS: 5.0000 mg | ORAL_TABLET | Freq: Every day | ORAL | 0 refills | Status: DC

## 2022-02-03 NOTE — Telephone Encounter (Signed)
BP med to be refilled ? ?[place] and is process to get another doctor ? ? ?[facility]  ?[address]  ?[facility] [place]  ?

## 2022-02-04 NOTE — Telephone Encounter (Signed)
Contact patient with interpreter on the line his medication was sent to the [facility] in [place]. Advise pt he need to locate a doctor and request new appointment. ?

## 2022-02-18 ENCOUNTER — Inpatient Hospital Stay
Admit: 2022-02-18 | Discharge: 2022-02-18 | Disposition: A | Discharge status: Home / Self Care | Payer: MEDICAID | Attending: Physician Assistant

## 2022-02-18 DIAGNOSIS — J020 Streptococcal pharyngitis: Secondary | ICD-10-CM

## 2022-02-18 LAB — COVID-19, FLU A/B, AND RSV COMBO
Influenza A by PCR: NEGATIVE
Influenza B by PCR: NEGATIVE
RSV by PCR: NEGATIVE
SARS-CoV-2, PCR: NEGATIVE

## 2022-02-18 LAB — STREP A, PCR: Streptococcus agalactiae by PCR: POSITIVE — AB

## 2022-02-18 MED ORDER — AMOXICILLIN 500 MG PO CAPS
500 MG | ORAL_CAPSULE | Freq: Two times a day (BID) | ORAL | 0 refills | Status: AC
Start: 2022-02-18 — End: 2022-02-25

## 2022-02-18 NOTE — Discharge Instructions (Signed)
You have tested positive for strep throat  Will start antibiotics, please take as directed  Please contact [facility] to establish primary care  May return to the ER for severe symptoms

## 2022-02-18 NOTE — ED Provider Notes (Signed)
Chief Complaint   Patient presents with    Pharyngitis       HPI  Patient is a 26 year old male recently moved to [place], was outside yesterday for 3 hours helping his [place] now patient presents to ED reporting sore throat, body aches x1 day   Patient denies further subjective symptoms     Medical History:  Current Problem List:   There is no problem list on file for this patient.      Past Medical History:  Past Medical History:   Diagnosis Date    Hypertension        History reviewed. No pertinent surgical history.    Social History     Socioeconomic History    Marital status: Divorced     Spouse name: Not on file    Number of children: Not on file    Years of education: Not on file    Highest education level: Not on file   Occupational History    Not on file   Tobacco Use    Smoking status: Never    Smokeless tobacco: Never   Substance and Sexual Activity    Alcohol use: Yes    Drug use: Never    Sexual activity: Not on file   Other Topics Concern    Not on file   Social History Narrative    Not on file     Social Determinants of Health     Financial Resource Strain: Not on file   Food Insecurity: Not on file   Transportation Needs: Not on file   Physical Activity: Not on file   Stress: Not on file   Social Connections: Not on file   Intimate Partner Violence: Not on file   Housing Stability: Not on file       Family History:  No family history on file.    Medications:  Patient medication list reviewed  Previous Medications    No medications on file       Anticoagulants / Antiplatelet medications:  This patient does not have an active medication from one of the medication groupers.    Allergies:    Patient has no known allergies.    Review of Systems  See history of present illness for relevant review of systems.    ED Triage Vitals [02/18/22 1504]   BP Temp Temp Source Heart Rate Resp SpO2 Height Weight   (!) 154/97 97.1 ??F (36.2 ??C) Skin 75 16 100 % 5\' 10"   (1.778 m) 229 lb (103.9 kg)     Physical Exam  Vitals and nursing note reviewed.   Constitutional:       General: He is not in acute distress.     Appearance: Normal appearance. He is not ill-appearing, toxic-appearing or diaphoretic.   HENT:      Mouth/Throat:      Mouth: Mucous membranes are moist.      Pharynx: Posterior oropharyngeal erythema present. No oropharyngeal exudate.   Eyes:      Pupils: Pupils are equal, round, and reactive to light.   Cardiovascular:      Rate and Rhythm: Normal rate.      Pulses: Normal pulses.   Pulmonary:      Effort: Pulmonary effort is normal.   Abdominal:      General: There is no distension.      Palpations: Abdomen is soft.      Tenderness: There is  no abdominal tenderness. There is no right CVA tenderness, left CVA tenderness or guarding.   Musculoskeletal:         General: Normal range of motion.   Skin:     General: Skin is warm and dry.      Capillary Refill: Capillary refill takes less than 2 seconds.   Neurological:      General: No focal deficit present.      Mental Status: He is alert and oriented to person, place, and time.   Psychiatric:         Mood and Affect: Mood normal.         Behavior: Behavior normal.       Medical Decision Making  Patient presents to ED as stated above   Patient's vital signs within acceptable limits   Physical exam is reassuring   Patient is reporting sore throat, body aches since yesterday states that he was outside for 3 hours [place] bottles in the [place] with his friend   Patient has also reported that he has recently moved to [place] the [place], he is seeking assistance with [facility] primary care  COVID flu RSV, strep test been ordered and pending              ED Course:  ED Course as of 02/18/22 1644   Tue Feb 18, 2022   1642 Patient has tested positive for Streptococcus, negative COVID flu RSV test   Will start patient on amoxicillin for ARDS that up the car occult pharyngitis  Recommend plenty of rest fluids  over-the-counter medications, saltwater gargles   Will provide patient with contact info for [facility] in order to help him establish a PCP   Patient may return to the ER for any new or worsening symptoms [MC]      ED Course User Index  [MC] Cristie Hem, PA       Procedures    Vital Signs for this visit:  Vitals:    02/18/22 1504   BP: (!) 154/97   Pulse: 75   Resp: 16   Temp: 97.1 ??F (36.2 ??C)   TempSrc: Skin   SpO2: 100%   Weight: 229 lb (103.9 kg)   Height: 5\' 10"  (1.778 m)       Lab findings during this visit (only abnormal values will be noted, if no value noted then the result was normal range):  Labs Reviewed   STREP A, PCR - Abnormal; Notable for the following components:       Result Value    Streptococcus agalactiae by PCR Positive (*)     All other components within normal limits   COVID-19, FLU A/B, AND RSV COMBO       Recent radiology studies including this visit:  No results found.    Medications given in the ED:  Medications - No data to display    Diagnosis:  1. Strep pharyngitis            Condition at disposition:  stable    DISPOSITION Decision To Discharge 02/18/2022 04:43:19 PM      Discharge prescriptions and/or changes if applicable:       Medication List        START taking these medications      amoxicillin 500 MG capsule  Commonly known as: AMOXIL  Take 1 capsule by mouth 2 times daily for 7 days  Where to Get Your Medications        These medications were sent to [id number] - [place], [place] - [address] - P [phone] - F [phone]  [address], [place] [address]      Phone: [phone]   amoxicillin 500 MG capsule         Follow-up if applicable:  [facility]  [address]  [address]  [place] [address]  [phone]  Call       [facility] EMERGENCY DEPARTMENT  [address] [place] [address]  [phone]    If symptoms worsen    Please note that portions of this document were created  using the M*Modal Fluency Direct dictation system.  Any inconsistencies or typographical errors may be the result of mis-transcription that persist in spite of proof-reading and should be addressed with the document creator.        Cristie Hem, [place]  02/18/22 1644

## 2022-02-18 NOTE — ED Triage Notes (Signed)
C/o sore throat and HA after being out in the cold for work yesterday.

## 2022-04-15 ENCOUNTER — Emergency Department: Admit: 2022-04-15 | Payer: MEDICAID

## 2022-04-15 ENCOUNTER — Inpatient Hospital Stay
Admit: 2022-04-15 | Discharge: 2022-04-15 | Disposition: A | Discharge status: Home / Self Care | Payer: MEDICAID | Attending: Medical

## 2022-04-15 DIAGNOSIS — R079 Chest pain, unspecified: Secondary | ICD-10-CM

## 2022-04-15 LAB — COMPREHENSIVE METABOLIC PANEL
ALT: 18 U/L (ref 3–35)
AST: 18 U/L (ref 15–40)
Albumin/Globulin Ratio: 1.2
Albumin: 4.2 g/dL (ref 3.5–5.0)
Alk Phosphatase: 68 U/L (ref 35–100)
Anion Gap: 13 mmol/L
BUN: 12 MG/DL (ref 7–20)
Bun/Cre Ratio: 16
CO2: 25 mmol/L (ref 20–32)
Calcium: 9.4 MG/DL (ref 8.8–10.5)
Chloride: 102 mmol/L (ref 100–110)
Creatinine: 0.75 MG/DL (ref 0.40–1.20)
Est, Glom Filt Rate: >60 mL/min/{1.73_m2}
Globulin: 3.6 g/dL
Glucose: 101 mg/dL (ref 75–110)
Potassium: 4.1 mmol/L (ref 3.5–5.0)
Sodium: 136 mmol/L (ref 135–145)
Total Bilirubin: 1.4 mg/dL — ABNORMAL HIGH (ref 0.10–1.20)
Total Protein: 7.8 g/dL (ref 6.2–8.0)

## 2022-04-15 LAB — EKG 12-LEAD
Atrial Rate: 61 ms
EKG I-40 HORIZONTAL AXIS: 96 deg
EKG P DURATION: 112 ms
EKG P FRONT AXIS: 7 deg
EKG P HORIZONTAL AXIS: 67 deg
EKG Q ONSET: 503 ms
EKG QRS AXIS: 28 deg
EKG QRS HORIZONTAL AXIS: -10 deg
EKG QRSD INTERVAL: 84 ms
EKG QTCB: 374 ms
EKG QTCF: 373 ms
EKG RR INTERVAL: 984 ms
EKG S-T FRONT AXIS: 17 deg
EKG S-T HORIZONTAL AXIS: 78 deg
EKG T HORIZONTAL AXIS: 63 deg
EKG T WAVE AXIS: 15 deg
EKG T-40 FRONT AXIS: 34 deg
EKG T-40 HORIZONTAL AXIS: -28 deg
Heart Rate: 61 {beats}/min
P-R Interval: 181 ms
Q-T Interval: 371 ms

## 2022-04-15 LAB — CBC WITH AUTO DIFFERENTIAL
Absolute Eos #: 0 10*3/uL (ref 0.0–0.5)
Absolute Immature Granulocyte: 0 10*3/uL (ref 0.0–0.1)
Absolute Lymph #: 2.1 10*3/uL (ref 1.0–4.5)
Absolute Mono #: 0.4 10*3/uL (ref 0.1–0.8)
Basophils Absolute: 0 10*3/uL (ref 0.0–0.2)
Basophils: 1 %
Eosinophils %: 1 %
Hematocrit: 41.8 % — ABNORMAL LOW (ref 42.0–52.0)
Hemoglobin: 13.5 g/dL — ABNORMAL LOW (ref 14.0–18.0)
Immature Granulocytes: 0 %
Lymphocytes: 53 %
MCH: 27.8 PG — ABNORMAL LOW (ref 28.0–34.0)
MCHC: 32.3 g/dL (ref 32.0–36.0)
MCV: 86.2 FL (ref 80.0–100.0)
MPV: 11.3 FL (ref 7.0–12.0)
Monocytes: 9 %
Nucleated RBCs: 0 PER 100 WBC
Platelets: 254 10*3/uL (ref 150–400)
RBC: 4.85 M/uL (ref 4.50–6.00)
RDW: 12.9 % (ref 11.5–13.5)
Seg Neutrophils: 36 %
Segs Absolute: 1.4 10*3/uL — ABNORMAL LOW (ref 1.9–7.8)
WBC: 4 10*3/uL — ABNORMAL LOW (ref 4.8–10.8)
nRBC: 0 10*3/uL

## 2022-04-15 LAB — TROPONIN
Troponin, High Sensitivity: <5 pg/mL (ref 0.00–20.00)
Troponin, High Sensitivity: <5 pg/mL (ref 0.00–20.00)

## 2022-04-15 LAB — TSH: TSH: 1.276 u[IU]/mL (ref 0.340–5.600)

## 2022-04-15 NOTE — ED Notes (Signed)
Patient ambulated to restroom independently.      Marcene Brawn, RN  [phone]

## 2022-04-15 NOTE — Discharge Instructions (Addendum)
Your workup is reassuring that the problem does not have anything to do with your heart or lungs.  This is most likely musculoskeletal.  I would recommend that you try some Tylenol or ibuprofen 600 mg of ibuprofen every 6 hours or Tylenol [phone] mg every 6 hours for your discomfort.  You can try ice and heat.  You could also try a muscle pain creams such as Ben-Gay or Biofreeze.  Call [facility] today to arrange for ER follow-up visit and to establish care with a primary care provider.  As we discussed your blood pressure is a month elevated.  Will not restart sure amlodipine now but would recommend watching her diet, weight loss and exercise for 30 minutes at least 5 days a week.  Your blood pressure can be readdressed at your follow-up visit.  If you develop worsening or a different chest pain, shortness of breath, pain to breathe, coughing up blood, or other concerning symptom return to the ER for follow-up.

## 2022-04-15 NOTE — ED Provider Notes (Signed)
Chief Complaint   Patient presents with    Chest Pain       HPI  26 yo male with CC left sided chest pain.  Began yesterday however has had this occur intermittently over the last 6 months. Moderate pain. No pleuritic component.  Nothing makes it better or worse.  Denies fever, chills, Lightheadedness, dizziness, sweats, cough, SOB, sputum production, Abdominal pain, N/V or diarrhea.   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  Denies any trauma.  He reports a history of hypertension however he is not taking his amlodipine for I year.  Does not have a local PCP.    Medical History:  Current Problem List:   There is no problem list on file for this patient.      Past Medical History:  Past Medical History:   Diagnosis Date    Hypertension        No past surgical history on file.    Social History     Socioeconomic History    Marital status: Divorced     Spouse name: Not on file    Number of children: Not on file    Years of education: Not on file    Highest education level: Not on file   Occupational History    Not on file   Tobacco Use    Smoking status: Never    Smokeless tobacco: Never   Substance and Sexual Activity    Alcohol use: Yes    Drug use: Never    Sexual activity: Not on file   Other Topics Concern    Not on file   Social History Narrative    Not on file     Social Determinants of Health     Financial Resource Strain: Not on file   Food Insecurity: Not on file   Transportation Needs: Not on file   Physical Activity: Not on file   Stress: Not on file   Social Connections: Not on file   Intimate Partner Violence: Not on file   Housing Stability: Not on file       Family History:  No family history on file.    Medications:  Patient medication list reviewed  There are no discharge medications for this patient.      Anticoagulants / Antiplatelet medications:  This patient does not have an active  medication from one of the medication groupers.    Allergies:    Patient has no known allergies.    Review of Systems  See history of present illness for relevant review of systems.    ED Triage Vitals [04/15/22 0910]   BP Temp Temp Source Pulse Respirations SpO2 Height Weight - Scale   (!) 138/100 98.4 F (36.9 C) Oral 74 16 97 % -- 234 lb (106.1 kg)     Physical Exam  Vitals and nursing note reviewed.   Constitutional:       General: He is not in acute distress.     Appearance: He is not ill-appearing.      Comments: overweight.   HENT:      Head: Normocephalic and atraumatic.      Right Ear: External ear normal.      Left Ear: External ear normal.      Nose: Nose normal.      Mouth/Throat:      Mouth: Mucous membranes are moist.  Pharynx: Oropharynx is clear.   Eyes:      General: No scleral icterus.     Extraocular Movements: Extraocular movements intact.      Conjunctiva/sclera: Conjunctivae normal.      Pupils: Pupils are equal, round, and reactive to light.   Cardiovascular:      Rate and Rhythm: Normal rate and regular rhythm.      Heart sounds: No murmur heard.    No friction rub. No gallop.   Pulmonary:      Effort: Pulmonary effort is normal.      Breath sounds: Normal breath sounds.   Chest:      Comments: Gynecomastia. Palpation of chest reproduces tenderness on the left anterior, but minimal discomfort.  Abdominal:      General: Bowel sounds are normal. There is no distension.      Palpations: Abdomen is soft.      Tenderness: There is no abdominal tenderness.   Musculoskeletal:      Cervical back: Neck supple.      Right lower leg: No edema.      Left lower leg: No edema.      Comments: Calves soft and nontender.   Skin:     General: Skin is warm and dry.   Neurological:      General: No focal deficit present.      Mental Status: He is alert and oriented to person, place, and time.   Psychiatric:      Comments: Eye contact fair, flat affect.       Medical Decision Making  Amount and/or Complexity  of Data Reviewed  Labs: ordered.  Radiology: ordered.    This is a 26 year old African male from [place] presenting with left-sided chest pain without other associated symptoms.  He denies any depression or anxiety.  He has had this intermittently over the last 6 months.  He does endorse that he lost his brother and he has felt a sickly often on since then.  Has a history of hypertension however he has been in [place] for 3 months having moved here from [place] and does not regularly take his blood pressure medicine and is unable to recall the name.  Will obtain EKG, labs, chest x-ray.  Have low suspicion for PE.  Particularly with this is been intermittent over 6 months.  Could be related to anxiety however he declines being anxious.  Could be musculoskeletal pain.  Could be related to GI source however he denies any heartburn or indigestion.  Will rule out ACS or lung process.          ED Course:  ED Course as of 04/15/22 1618   Tue Apr 15, 2022   0950 Twelve lead EKG interpreted by me shows normal sinus rhythm a rate of 61.  There is J-point elevation noted up to this is not felt to be consistent with STEMI.  Normal intervals and axis.  No concerning T-wave changes.   [PO]   1010 Independent interpretation of two view chest x-ray shows no acute cardiopulmonary disease. [PO]   1136 Labs are unremarkable with the exception of a white count of 4.0 hemoglobin 13.5 hematocrit 41.8 and total bili of 1.40.  His initial troponin is less than 5.  Given that he has had this pain now since yesterday I do not think that a 2nd troponin is necessary. [PO]      ED Course User Index  [PO] Nelida Gores, PA     His  blood pressures were borderline high.  At this time do not think that it is necessary to resume his blood pressure medications. I have encouraged him to contact [facility]  [facility] choice to schedule an ER follow-up visit regarding his chest pain and elevated blood pressures and he can then establish care.  Have  recommended exercise, weight loss, DASH diet.  Chest pain is felt to be noncardiac and musculoskeletal in nature.  Procedures    Vital Signs for this visit:  Vitals:    04/15/22 1000 04/15/22 1045 04/15/22 1100 04/15/22 1200   BP: 121/88 115/84 121/89 (!) 129/99   Pulse: 63 61 65 66   Resp: 23 26 17 23    Temp:       TempSrc:       SpO2:   99% 96%   Weight:           Lab findings during this visit (only abnormal values will be noted, if no value noted then the result was normal range):  Labs Reviewed   CBC WITH AUTO DIFFERENTIAL - Abnormal; Notable for the following components:       Result Value    WBC 4.0 (*)     Hemoglobin 13.5 (*)     Hematocrit 41.8 (*)     MCH 27.8 (*)     Segs Absolute 1.4 (*)     All other components within normal limits   COMPREHENSIVE METABOLIC PANEL - Abnormal; Notable for the following components:    Total Bilirubin 1.40 (*)     All other components within normal limits   TSH   TROPONIN   TROPONIN       Recent radiology studies including this visit:  XR CHEST (2 VW)   Final Result   Unremarkable two view chest examination             Medications given in the ED:  Medications - No data to display    Diagnosis:  1. Chest pain, unspecified type    2. Hypertension, unspecified type            Condition at disposition:  stable    DISPOSITION Decision To Discharge 04/15/2022 12:00:17 PM      Discharge prescriptions and/or changes if applicable:       Medication List      You have not been prescribed any medications.         Follow-up if applicable:  [facility]  [phone]  Call today  For soonest available ER follow-up visit and, To establish care      Please note that portions of this document were created using the M*Modal Fluency Direct dictation system.  Any inconsistencies or typographical errors may be the result of mis-transcription that persist in spite of proof-reading and should be addressed with the document creator.        Nelida Gores, [place]  04/16/22 [phone]

## 2022-04-15 NOTE — ED Triage Notes (Signed)
Chest pain since yesterday. Pt reports reports increased pain when touching it.

## 2022-04-15 NOTE — Care Coordination-Inpatient (Signed)
CM reviewed chart. CM will continue to monitor for discharge needs.

## 2022-05-16 ENCOUNTER — Other Ambulatory Visit: Payer: Self-pay | Admitting: Nurse Practitioner

## 2023-03-04 NOTE — Telephone Encounter (Signed)
Pt needs a refill on blood pressure medication and has been scheduled for a bridge with [facility].    Future Appointments   Date Time Provider Department Center   03/23/2023  2:30 PM James Ivanoff [place], [place] [facility] AMB   01/29/2024  2:00 PM Redmond Baseman, PA [facility] AM[facility]       Packet mailed.

## 2023-03-04 NOTE — Telephone Encounter (Signed)
Pt name and DOB verified.    Who is calling, name of caller? Pt    Who is your current or most recent primary care provider and   which hospital were they affiliated with? In [place]    How long did you see them for? Unknown    Is there a reason that you are looking for a new provider? Always good to have a provider to check pt all the time    Would you like to provide your e-mail to sign up for out My Chart system? NA  The benefits of being a My Chart member are:    -Request medical appointments  -View your health summary from the My Chart electronic health record.  -View test results.  -Request prescription renewals.  -Access trusted health information resources.  -Communicate electronically and securely with your medical care team.    Would you like a text message or letter with your activation code?     Do you have a preference about the provider you will see? NA    When was your last Annual or Well Child Exam? 2021 or 2022?    Do you have any ongoing or chronic conditions you are currently being treated   for (e.g. Hypertension, Diabetes, Depression, etc)? If yes list: Blood Pressure in 2022    Have you been seen by a provider for these chronic conditions in the last 6 months? If so, who? no    Do you need an appointment to establish care or is there something more   urgent you need to be seen for? Explain: Because of high blood pressure and would like to meet the Doctor or Nurse    Are you on any chronic controlled substances such as opioids, stimulants, or Benzodiazepines? (if no go ahead and book appointment, if yes please read the following: NA    "Please be aware that many of our providers will not prescribe long-term controlled substances, even if these were prescribed to you by a previous provider.  This means your prescription may be changed, decreased, or discontinued by the provider you will be seeing. For all other prescriptions, it is important that you understand that a provider may not be  able to refill prescriptions at the first visit as they need to get to know more about you and your medical conditions and history before safely prescribing medications"   Informed? Yes or No Na      Do you have any questions about what we discussed? NA  (If no go ahead and book, if yes please get the details and send to the practice)      Jerry Leonard here is your complete list of upcoming appointments.     No future appointments.      **Thank you for choosing [place] for your health care needs.   Jerry Leonard I do want to let you know that you will need to contact your insurance company and make them aware of your new Primary Care Provider which is None, None. Also Jerry Leonard [id number] a new patient packet will be coming to you. Would you like to have this release of information faxed to you? Mail; If faxed what is the number? Mail If not, you can stop by our practice to complete the form or we can mail you the form. Mail    Notes:     CB#: [id number] (home)

## 2023-03-05 NOTE — Telephone Encounter (Signed)
New patient packet mailed 03/05/23     Patient will be scheduled upon return of NP packet  to the office.

## 2023-03-05 NOTE — Telephone Encounter (Signed)
Noted Signed by Jamekia Gannett J Genene Kilman,CCMA

## 2023-03-10 NOTE — Telephone Encounter (Signed)
No ROI or outside records scanned to chart, minimal records available in [facility] for a bridge abstraction. Postpone 2 weeks from new patient appointment for outside records abstraction.

## 2023-03-10 NOTE — Progress Notes (Signed)
No ROI or outside records scanned to chart, minimal records available in [facility] for a bridge abstraction. Postpone 2 weeks from new patient appointment for outside records abstraction.

## 2023-03-23 ENCOUNTER — Ambulatory Visit
Admit: 2023-03-23 | Discharge: 2023-03-23 | Payer: MEDICAID | Attending: Physician Assistant | Primary: Physician Assistant

## 2023-03-23 DIAGNOSIS — I10 Essential (primary) hypertension: Secondary | ICD-10-CM

## 2023-03-23 MED ORDER — LISINOPRIL 10 MG PO TABS
10 | ORAL_TABLET | Freq: Every day | ORAL | 1 refills | Status: DC
Start: 2023-03-23 — End: 2023-03-24

## 2023-03-23 NOTE — Assessment & Plan Note (Signed)
Blood pressure medication prescribed

## 2023-03-23 NOTE — Progress Notes (Signed)
Jerry Leonard (DOB:  06/24/1996) is a 27 y.o. male,New patient, here for evaluation of the following chief complaint(s):  Medication Refill (Blood pressure medications. Patient has been off these medications x1 year. Looking to see if he needs to restart. Was taking amlodipine 5 mg daily), Weight Management, and New Patient (Moved here from [facility])      Assessment & Plan   1. Primary hypertension  Assessment & Plan:   Blood pressure medication prescribed  2. Class 3 severe obesity due to excess calories with serious comorbidity and body mass index (BMI) of 40.0 to 44.9 in adult [facility])  Assessment & Plan:   Glycemic index and the timing of exercise on weight is discussed      Return in about 4 weeks (around 04/20/2023) for bp check.       Subjective   Patient presents to establish care.  He has had high blood pressure for many years.  He was on amlodipine in the past which he tolerated well but it did not control his blood pressure all the time.  He ran out of his amlodipine 2 months ago.  He is also concerned about weight gain.  He states he is gained 20 lb in the last year.  He only eats once a day but he states he eats a lot.    Medication Refill    Weight Management        Review of Systems       Objective   Physical Exam  Vitals reviewed.   Constitutional:       Appearance: Normal appearance. He is obese.   HENT:      Head: Normocephalic and atraumatic.   Eyes:      Conjunctiva/sclera: Conjunctivae normal.   Cardiovascular:      Rate and Rhythm: Normal rate and regular rhythm.      Heart sounds: Normal heart sounds.   Pulmonary:      Effort: Pulmonary effort is normal.      Breath sounds: Normal breath sounds.   Neurological:      Mental Status: He is alert and oriented to person, place, and time.      Cranial Nerves: No cranial nerve deficit.      Gait: Gait normal.   Psychiatric:         Mood and Affect: Mood normal.         Behavior: Behavior normal.         Thought Content: Thought content  normal.         Judgment: Judgment normal.                  An electronic signature was used to authenticate this note.    --Redmond Baseman, PA

## 2023-03-23 NOTE — Assessment & Plan Note (Signed)
Glycemic index and the timing of exercise on weight is discussed

## 2023-03-24 MED ORDER — LISINOPRIL 10 MG PO TABS
10 | ORAL_TABLET | Freq: Every day | ORAL | 0 refills | Status: AC
Start: 2023-03-24 — End: ?

## 2023-03-24 NOTE — Telephone Encounter (Signed)
Sent yesterday for 30 days during office visit.  Pt requesting a 90 day supply  This is a new medication for pt/restart.   Please advise on sending in for 90 days.

## 2023-04-27 ENCOUNTER — Ambulatory Visit: Payer: MEDICAID | Primary: Physician Assistant

## 2023-05-11 NOTE — Telephone Encounter (Signed)
Lab appt scheduled 6/24 at 3:35 for weight and BP check.

## 2023-05-18 ENCOUNTER — Ambulatory Visit: Payer: MEDICAID | Primary: Physician Assistant

## 2023-05-29 NOTE — Telephone Encounter (Signed)
Noted, insurance in chart already

## 2023-05-29 NOTE — Telephone Encounter (Signed)
Patient has new insurance. Please update in registration.    Name of insurance company: [employer] company phone number (on back of card): [phone]     Member ID: [id number]     Provider bill to address (on back of card): n/a     [employer] Information (if different from the patient): self     Address            SSN     Phone number     Employer/Status

## 2023-08-07 MED ORDER — LISINOPRIL 10 MG PO TABS
10 | ORAL_TABLET | Freq: Every day | ORAL | 0 refills | Status: DC
Start: 2023-08-07 — End: 2023-09-21

## 2023-08-07 NOTE — Telephone Encounter (Signed)
 Refill protocol not met due to labs, patient does not have upcoming OV until 01/29/2024 to PCP to advise on labs and send if appropriate    Medications Requested:  Requested Prescriptions     Pending Prescriptions Disp Refills    lisinopril  (PRINIVIL ;ZESTRIL ) 10 MG tablet [Pharmacy Med Name: LISINOPRIL  10MG  TABLETS] 90 tablet 0     Sig: TAKE 1 TABLET BY MOUTH DAILY       Preferred Pharmacy:   [facility] #[id number] - [place], [place] - [address] - P [phone] - F [phone]  [address]  [place] [address]  Phone: [phone] Fax: [phone]      Date of Last Refill: 03/24/2023    Prescription Refill Protocol reviewed:YES    Allergy List Reviewed and Verified: YES    Possible medication to medication interactions reviewed: YES    Last appt @ PCP Office: 03/23/2023     Future Appointments   Date Time Provider Department Center   01/29/2024  2:00 PM Antonetta Endow [place], [place] [facility] AM[facility]       MOST RECENT BLOOD PRESSURES  BP Readings from Last 3 Encounters:   03/23/23 (!) 150/118   04/15/22 (!) 129/99   02/18/22 (!) 154/97         MOST RECENT LAB DATA  Lab Results   Component Value Date/Time    K 4.1 04/15/2022 09:59 AM    ALT 18 04/15/2022 09:59 AM    TSH 1.276 04/15/2022 09:59 AM    HGB 13.5 04/15/2022 09:59 AM    HCT 41.8 04/15/2022 09:59 AM

## 2023-09-21 MED ORDER — LISINOPRIL 10 MG PO TABS
10 MG | ORAL_TABLET | Freq: Every day | ORAL | 0 refills | Status: AC
Start: 2023-09-21 — End: 2023-10-26

## 2023-09-21 NOTE — Telephone Encounter (Signed)
Medications Requested:one 30 day supply has already been sent. Labs were failed again. No upcoming appt with pcp and no active lab orders       Requested Prescriptions     Pending Prescriptions Disp Refills    lisinopril (PRINIVIL;ZESTRIL) 10 MG tablet [Pharmacy Med Name: LISINOPRIL 10MG  TABLETS] 30 tablet 0     Sig: TAKE 1 TABLET BY MOUTH DAILY       Preferred Pharmacy:   [facility] #[id number] - [place], [place] - [address] - P [phone] Carmon Ginsberg [phone]  [address]  [place] [address]  Phone: [phone] Fax: [phone]      Date of Last Refill: 08/07/23    Prescription Refill Protocol reviewed: Yes    Allergy List Reviewed and Verified: Yes    Possible medication to medication interactions reviewed: Yes    Last appt @ PCP Office: 03/23/2023     Future Appointments   Date Time Provider Department Center   01/29/2024  2:00 PM James Ivanoff [place], [place] [facility] AMB       MOST RECENT BLOOD PRESSURES  BP Readings from Last 3 Encounters:   03/23/23 (!) 150/118   04/15/22 (!) 129/99   02/18/22 (!) 154/97         MOST RECENT LAB DATA  Lab Results   Component Value Date/Time    K 4.1 04/15/2022 09:59 AM    ALT 18 04/15/2022 09:59 AM    TSH 1.276 04/15/2022 09:59 AM    HGB 13.5 04/15/2022 09:59 AM    HCT 41.8 04/15/2022 09:59 AM

## 2023-09-21 NOTE — Telephone Encounter (Signed)
Needs appointment before next refill.

## 2023-09-22 ENCOUNTER — Ambulatory Visit: Payer: PRIVATE HEALTH INSURANCE | Primary: Physician Assistant

## 2023-09-22 NOTE — Telephone Encounter (Signed)
Pt called and scheduled follow up with BP check as requested, ok to send 90 day supply now?

## 2023-09-22 NOTE — Addendum Note (Signed)
Addended by: Laqueta Ruby on: 09/22/2023 11:55 AM     Modules accepted: Orders

## 2023-09-22 NOTE — Telephone Encounter (Signed)
Pt needs OV scheduled. Note sent to front staff to schedule.

## 2023-09-23 NOTE — Telephone Encounter (Signed)
Pt cancelled appt. Keeping the 30 day rx that was sent.

## 2023-09-24 NOTE — Telephone Encounter (Signed)
LVM to call back and scheduled blood pressure check per Scott.     Scheduled anywhere with lab this week or next    Pt has cancelled or No Showed the last 3 BP checks with weight check.     Pt lives in [place], [place] to call in 3 BP checks if easier for them

## 2023-09-29 NOTE — Telephone Encounter (Signed)
Attempt 2 to get scheduled left message

## 2023-10-07 NOTE — Telephone Encounter (Signed)
 Pt lives in [place]. Letter sent

## 2023-10-26 MED ORDER — LISINOPRIL 10 MG PO TABS
10 | ORAL_TABLET | Freq: Every day | ORAL | 0 refills | Status: AC
Start: 2023-10-26 — End: ?

## 2023-10-26 NOTE — Telephone Encounter (Signed)
 Refill protocol not met due to labs and OV but patient has an upcoming OV on 01/29/2024    Medications Requested:  Requested Prescriptions     Pending Prescriptions Disp Refills    lisinopril (PRINIVIL;ZESTRIL) 10 MG tablet [Pharmacy Med Name: LISINOPRIL 64M

## 2024-01-29 ENCOUNTER — Ambulatory Visit: Payer: PRIVATE HEALTH INSURANCE | Attending: Physician Assistant | Primary: Physician Assistant

## 2024-01-29 MED ORDER — LISINOPRIL 10 MG PO TABS
10 MG | ORAL_TABLET | Freq: Every day | ORAL | 0 refills | Status: DC
Start: 2024-01-29 — End: 2024-05-09

## 2024-01-29 NOTE — Telephone Encounter (Signed)
 Medications Requested:  Requested Prescriptions     Pending Prescriptions Disp Refills    lisinopril (PRINIVIL;ZESTRIL) 10 MG tablet [Pharmacy Med Name: LISINOPRIL 10MG  TABLETS] 90 tablet 0     Sig: TAKE 1 TABLET BY MOUTH DAILY       Preferred Pharmacy:   [facility] #[id number] - [place], [place] - [address] - P [phone] - F [phone]  [address]  [place] [address]  Phone: [phone] Fax: [phone]       Name from pharmacy: LISINOPRIL 10MG  TABLETS         Will file in chart as: lisinopril (PRINIVIL;ZESTRIL) 10 MG tablet    Sig: TAKE 1 TABLET BY MOUTH DAILY    Disp: 90 tablet    Refills: 0 (Pharmacy requested: Not specified)    Start: 01/29/2024    Class: Normal    Non-formulary    Last ordered: 3 months ago (10/26/2023) by Redmond Baseman, PA    Last refill: 10/26/2023    Rx #: [id number]    ACE Inhibitors Protocol Failed03/05/2024 03:57 AM   Protocol Details Last creatinine level resulted within the past 12 months    Last potassium level normal, within the past 12 months    Visit with authorizing provider in past 9 months or upcoming 90 days      To be filled at: [facility] #[id number] - [place], [place] - [address] - P [phone] Carmon Ginsberg [phone]               Date of Last Refill: 10/26/2023    Prescription Refill Protocol reviewed: Yes  Unable to sign, labs due per protocol, no future labs ordered. Holding for provider    Signed by Charlie Pitter, MA   Allergy List Reviewed and Verified: Yes    Possible medication to medication interactions reviewed: Yes    Last appt @ PCP Office: 03/23/2023     Future Appointments   Date Time Provider Department Center   02/08/2024 11:30 AM Redmond Bas[place], [place] [facility]       MOST RECENT BLOOD PRESSURES  BP Readings from Last 3 Encounters:   03/23/23 (!) 150/118   04/15/22 (!) 129/99   02/18/22 (!) 154/97         MOST RECENT LAB DATA  Lab Results   Component Value Date/Time    K 4.1 04/15/2022 09:59 AM    ALT 18 04/15/2022 09:59 AM     TSH 1.276 04/15/2022 09:59 AM    HGB 13.5 04/15/2022 09:59 AM    HCT 41.8 04/15/2022 09:59 AM

## 2024-01-29 NOTE — Telephone Encounter (Signed)
 No outside records scanned to chart for abstracting

## 2024-01-29 NOTE — Telephone Encounter (Signed)
 [facility] EMERGENCY DEPARTMENT  EMERGENCY DEPARTMENT ENCOUNTER        Pt Name: Jerry Leonard  MRN: [id number]  Birthdate 25-Dec-1943  Date of evaluation: 01/29/2024  Provider: Hinda Lenis, MD  PCP: No primary care provider on file.  Note Started: 5:45 PM EST 01/29/24    CHIEF COMPLAINT       Chief Complaint   Patient presents with    social work problem     Pt via EMS from home, approved to go to assisted living, paperwork not done yet, unable to care for self at home, denies complaints at this time        HISTORY OF PRESENT ILLNESS: 1 or more Elements     History from : Patient    Limitations to history : None    Jerry Leonard is a 28 y.o. male who presents with concern that he is unable to care for himself at home patient has very poorly controlled diabetes.  History of peripheral arterial disease patient has a left above-knee amputation.  Patient has gradually been having more and more difficulty caring for himself at home did live by himself.  Has been approved to go to assisted living but his financial clearance and paperwork has not yet gone through he is now unable to care for himself unable to perform ADLs. He has no other complaints at this time.  He does have chronic swelling and chronic redness of his right leg but states that there is no new findings he has no pain.    Nursing Notes were all reviewed and agreed with or any disagreements were addressed in the HPI.    REVIEW OF SYSTEMS :      Review of Systems   Constitutional: Negative.  Negative for chills and fever.   HENT: Negative.     Respiratory: Negative.  Negative for shortness of breath.    Cardiovascular: Negative.  Negative for chest pain.   Gastrointestinal: Negative.  Negative for abdominal pain.   Musculoskeletal: Negative.  Negative for back pain.   Skin: Negative.    Neurological: Negative.  Negative for headaches.       Positives and Pertinent negatives as per HPI.     SURGICAL HISTORY     Past Surgical History:    Procedure Laterality Date    ABOVE KNEE AMPUTATION Left        CURRENTMEDICATIONS       Previous Medications    ACETAMINOPHEN (TYLENOL) 500 MG TABLET    Take 1 tablet by mouth every 6 hours as needed for Pain    ALBUTEROL (PROVENTIL) (2.5 MG/3ML) 0.083% NEBULIZER SOLUTION    Take 3 mLs by nebulization every 6 hours as needed for Wheezing    ASPIRIN 81 MG CHEWABLE TABLET    Take 1 tablet by mouth daily    ATORVASTATIN (LIPITOR) 40 MG TABLET    Take 1 tablet by mouth nightly    CALCIUM CARB-CHOLECALCIFEROL (CALCIUM 600+D) 600-20 MG-MCG TABS    Take 1 tablet by mouth daily    CARBOXYMETHYLCELLULOSE 1 % OPHTHALMIC SOLUTION    Place 1 drop into both eyes 3 times daily as needed for Dry Eyes    FLUTICASONE (FLONASE) 50 MCG/ACT NASAL SPRAY    1 spray by Each Nostril route daily    INSULIN GLARGINE (LANTUS SOLOSTAR) 100 UNIT/ML INJECTION PEN    Inject 29 Units into the skin nightly    LEVEMIR FLEXPEN 100 UNIT/ML INJECTION PEN    INJECT  30 UNITS SUBCUTANEOUSLY TWO TIMES A DAY AS DIRECTED    MULTIPLE VITAMIN (MULTIVITAMIN) TABS TABLET    Take 1 tablet by mouth daily    VITAMIN C (ASCORBIC ACID) 500 MG TABLET    Take 1 tablet by mouth daily    VITAMIN D3 (CHOLECALCIFEROL) 10 MCG (400 UNIT) TABS TABLET    Take 1 tablet by mouth 2 times daily       ALLERGIES     Patient has no known allergies.    FAMILYHISTORY       Family History   Problem Relation Age of Onset    No Known Problems Mother         SOCIAL HISTORY       Social History     Tobacco Use    Smoking status: Former     Current packs/day: 0.00     Average packs/day: 1 pack/day for 25.0 years (25.0 ttl pk-yrs)     Types: Cigarettes     Start date: 47     Quit date: 51     Years since quitting: 35.2    Smokeless tobacco: Never   Vaping Use    Vaping status: Never Used   Substance Use Topics    Alcohol use: Not Currently    Drug use: Never       SCREENINGS                         CIWA Assessment  BP: (!) 148/70  Pulse: 73           PHYSICAL EXAM  1 or more Elements      ED Triage Vitals [01/29/24 1325]   BP Systolic BP Percentile Diastolic BP Percentile Temp Temp Source Pulse Respirations SpO2   (!) 98/47 -- -- 97.5 F (36.4 C) Oral 83 15 98 %      Height Weight - Scale         -- 68.5 kg (151 lb)             Physical Exam  Vitals and nursing note reviewed.   Constitutional:       General: He is not in acute distress.     Appearance: Normal appearance. He is well-developed and normal weight. He is not ill-appearing, toxic-appearing or diaphoretic.   HENT:      Head: Normocephalic and atraumatic.      Right Ear: External ear normal.      Left Ear: External ear normal.      Nose: Nose normal.   Eyes:      Extraocular Movements: Extraocular movements intact.   Cardiovascular:      Rate and Rhythm: Normal rate and regular rhythm.      Pulses: Normal pulses.   Pulmonary:      Effort: Pulmonary effort is normal.      Breath sounds: No decreased breath sounds.   Abdominal:      Palpations: Abdomen is soft.      Tenderness: There is no abdominal tenderness.   Musculoskeletal:      Cervical back: Normal range of motion and neck supple.      Comments: Left leg AKA, right leg with signs of venous stasis dermatitis with mild erythema with palpable DP and PT pulses, good perfusion   Skin:     General: Skin is warm and dry.      Capillary Refill: Capillary refill takes less than 2 seconds.   Neurological:  General: No focal deficit present.      Mental Status: He is alert and oriented to person, place, and time.   Psychiatric:         Mood and Affect: Mood normal.         Behavior: Behavior normal.         DIAGNOSTIC RESULTS   LABS:    Labs Reviewed   CBC WITH AUTO DIFFERENTIAL - Abnormal; Notable for the following components:       Result Value    RBC 3.81 (*)     Hemoglobin 10.2 (*)     Hematocrit 32.3 (*)     RDW 16.3 (*)     Neutrophils Absolute 7.8 (*)     All other components within normal limits   BASIC METABOLIC PANEL W/ REFLEX TO MG FOR LOW K - Abnormal; Notable for the  following components:    Glucose 116 (*)     BUN 43 (*)     Creatinine 1.4 (*)     Est, Glom Filt Rate 51 (*)     All other components within normal limits   POCT GLUCOSE       When ordered only abnormal lab results are displayed. All other labs were within normal range or not returned as of this dictation.    RADIOLOGY:   Non-plain film images such as CT, Ultrasound and MRI are read by the radiologist. Plain radiographic images are visualized and preliminarily interpreted by the ED Provider with the below findings:    Interpretation per the Radiologist below, if available at the time of this note:    No orders to display     No results found.    No results found.    PROCEDURES   Unless otherwise noted below, none     Procedures    CRITICAL CARE TIME   Total Critical Care time was 0 minutes, excluding separately reportable procedures.  There was a high probability of clinically significant/life threatening deterioration in the patient's condition which required my urgent intervention.       This includes multiple reevaluations, vital sign monitoring, pulse oximetry monitoring, telemetry monitoring, clinical response to the IV medications, reviewing the nursing notes, consultation time, dictation/documentation time, discussions with the patients/family, and interpretation of the labwork. (This time excludes time spent performing procedures). See ED course for details of reevaluation and medical decision making.     PAST MEDICAL HISTORY      has a past medical history of Cerebral artery occlusion with cerebral infarction (HCC), Diabetes mellitus (HCC), Hyperlipidemia, and Hypertension.     EMERGENCY DEPARTMENT COURSE and DIFFERENTIAL DIAGNOSIS/MDM:   Vitals:    Vitals:    01/29/24 1325 01/29/24 1500 01/29/24 1545   BP: (!) 98/47 (!) 126/56 (!) 148/70   Pulse: 83 75 73   Resp: 15 16 17    Temp: 97.5 F (36.4 C)     TempSrc: Oral     SpO2: 98% 97% 93%   Weight: 68.5 kg (151 lb)           Is this patient to be included  in the SEP-1 Core Measure due to severe sepsis or septic shock?   No   Exclusion criteria - the patient is NOT to be included for SEP-1 Core Measure due to:  Infection is not suspected      Chronic Conditions: CAD, diabetes, hyperlipidemia,    CONSULTS: (Who and What was discussed)  IP CONSULT TO HOSPITALIST    Discussion  with Other Profesionals : Admitting Team Dr. Raleigh Callas with hospitalist service for    Social Determinants : Unable to care for self at home    Records Reviewed : Inpatient Notes reviewed inpatient notes from October 26, 2022 when patient was seen for confusion and disorientation    CC/HPI Summary, DDx, ED Course, and Reassessment:     Differential Diagnosis:    Failure to thrive, venous stasis dermatitis, cellulitis, peripheral arterial disease, metabolic abnormality, sepsis, other      28 year old male presents for inability to perform ADLs at home.  Patient has no complaints at this time afebrile not tachycardic saturating well on room air has history of very poorly controlled diabetes as well as history of severe peripheral arterial disease has signs and symptoms concerning for venous stasis dermatitis on the right leg for possible cellulitis although patient has no fevers will obtain CBC and BMP to look for metabolic abnormality, renal dysfunction, lecture abnormality, anemia, abnormal blood cell count, thrombocytopenia, other.  Patient has mild hypertension.  I considered imaging of the right lower extremity but patient has no pain at this time consider possible CTA or ultrasound with patient has palpable pulses shows signs of good perfusion no concern for acute peripheral ischemia.  We spoke to social work who was unable to place patient at this time patient is still awaiting placement but is unsafe to care for himself at home we will plan on admission to the hospitalist service    CBC-CBC was ordered to rule out anemia, infection, abnormal platelet count, polycythemia, abnormal Red cell  pathology, or any other pathology that might be causing the patient's symptoms   BMP-BMP was ordered to rule out electrolyte abnormalities,  kidney dysfunction, electrolyte imbalance, or any other pathology that might be causing the patient's symptoms.           Patient was given the following medications:  Medications   albuterol (PROVENTIL) (2.5 MG/3ML) 0.083% nebulizer solution 2.5 mg (has no administration in time range)   aspirin chewable tablet 81 mg (has no administration in time range)   atorvastatin (LIPITOR) tablet 40 mg (has no administration in time range)   insulin detemir (LEVEMIR) injection pen 20 Units (has no administration in time range)   glucose-vitamin C chewable tablet 4 tablet (has no administration in time range)   dextrose bolus 10% 125 mL (has no administration in time range)     Or   dextrose bolus 10% 250 mL (has no administration in time range)   glucagon injection 1 mg (has no administration in time range)   dextrose 10 % infusion (has no administration in time range)   sodium chloride flush 0.9 % injection 5-40 mL (has no administration in time range)   sodium chloride flush 0.9 % injection 5-40 mL (has no administration in time range)   0.9 % sodium chloride infusion (has no administration in time range)   potassium chloride (KLOR-CON M) extended release tablet 40 mEq (has no administration in time range)     Or   potassium bicarb-citric acid (EFFER-K) effervescent tablet 40 mEq (has no administration in time range)     Or   potassium chloride 10 mEq/100 mL IVPB (Peripheral Line) (has no administration in time range)   magnesium sulfate 2000 mg in 50 mL IVPB premix (has no administration in time range)   enoxaparin (LOVENOX) injection 40 mg (has no administration in time range)   ondansetron (ZOFRAN-ODT) disintegrating tablet 4 mg (has no administration in time  range)     Or   ondansetron (ZOFRAN) injection 4 mg (has no administration in time range)   polyethylene glycol (GLYCOLAX)  packet 17 g (has no administration in time range)   acetaminophen (TYLENOL) tablet 650 mg (has no administration in time range)     Or   acetaminophen (TYLENOL) suppository 650 mg (has no administration in time range)   insulin lispro (HUMALOG,ADMELOG) injection vial 0-8 Units (has no administration in time range)       Disposition Considerations (tests considered but not done, Shared Decision Making, Pt Expectation of Test or Tx.): Shared decision making with this patient: Initial differential diagnoses were discussed with this patient, along with physical exam findings and an explanation what evaluation studies were necessary and why. Labs and Imaging results were explained to the patient in detail, including explanation of what these results mean. All treatment and disposition options including admission or discharge were discussed with the patient and a treatment plan with the patient's best short and long term care was made in collaboration with the patient.         I am the Primary Clinician of Record.    I PERSONALLY SAW THE PATIENT AND PERFORMED A SUBSTANTIVE PORTION OF THE VISIT INCLUDING ALL ASPECTS OF THE MEDICAL DECISION MAKING PROCESS.    FINAL IMPRESSION      1. Failure to thrive in adult    2. Impaired mobility and ADLs          DISPOSITION/PLAN     DISPOSITION Admitted 01/29/2024 03:27:22 PM               PATIENT REFERRED TO:  No follow-up provider specified.    DISCHARGE MEDICATIONS:  New Prescriptions    No medications on file       DISCONTINUED MEDICATIONS:  Discontinued Medications    No medications on file              (Please note that portions of this note were completed with a voice recognition program.  Efforts were made to edit the dictations but occasionally words are mis-transcribed.)    Hinda Lenis, MD (electronically signed)           Hinda Lenis, MD  01/29/24 [phone]

## 2024-02-08 ENCOUNTER — Inpatient Hospital Stay: Admit: 2024-02-08 | Payer: PRIVATE HEALTH INSURANCE | Primary: Physician Assistant

## 2024-02-08 ENCOUNTER — Ambulatory Visit
Admit: 2024-02-08 | Discharge: 2024-02-08 | Payer: PRIVATE HEALTH INSURANCE | Attending: Physician Assistant | Primary: Physician Assistant

## 2024-02-08 VITALS — BP 128/78 | HR 82 | Wt 244.7 lb

## 2024-02-08 DIAGNOSIS — Z0000 Encounter for general adult medical examination without abnormal findings: Secondary | ICD-10-CM

## 2024-02-08 DIAGNOSIS — Z7721 Contact with and (suspected) exposure to potentially hazardous body fluids: Secondary | ICD-10-CM

## 2024-02-08 LAB — CBC WITH AUTO DIFFERENTIAL
Basophils %: 0.3 %
Basophils Absolute: 0.02 10*3/uL (ref 0.0–0.2)
Eosinophils %: 0.7 %
Eosinophils Absolute: 0.04 10*3/uL (ref 0.0–0.5)
Hematocrit: 41 % — ABNORMAL LOW (ref 42.0–52.0)
Hemoglobin: 13.3 g/dL — ABNORMAL LOW (ref 14.0–18.0)
Immature Granulocytes %: 0.3 %
Immature Granulocytes Absolute: 0.02 10*3/uL (ref 0.00–0.06)
Lymphocytes Absolute: 3.05 10*3/uL (ref 1.0–4.5)
Lymphocytes: 51.7 %
MCH: 27.9 pg — ABNORMAL LOW (ref 28.0–34.0)
MCHC: 32.4 g/dL (ref 32.0–36.0)
MCV: 86 FL (ref 80.0–100.0)
MPV: 11.4 FL (ref 7.0–12.0)
Monocytes %: 9 %
Monocytes Absolute: 0.53 10*3/uL (ref 0.1–0.8)
Neutrophils Absolute: 2.24 10*3/uL (ref 1.9–7.8)
Nucleated RBCs: 0 /100{WBCs}
Platelets: 275 10*3/uL (ref 150–400)
RBC: 4.77 M/uL (ref 4.50–6.00)
RDW: 13.4 % (ref 11.5–13.5)
Seg Neutrophils: 38 %
WBC: 5.9 10*3/uL (ref 4.8–10.8)
nRBC: 0 10*3/uL

## 2024-02-08 LAB — LIPID PANEL
Cholesterol, Total: 183 mg/dL (ref 135–199)
HDL: 33 mg/dL — ABNORMAL LOW (ref 40–63)
LDL Cholesterol: 133.6 mg/dL — ABNORMAL HIGH (ref 70–129)
Non-HDL Cholesterol: 150 mg/dL — ABNORMAL HIGH (ref <150)
Triglycerides: 82 mg/dL (ref 35–149)

## 2024-02-08 LAB — COMPREHENSIVE METABOLIC PANEL
ALT: 15 U/L (ref 3–35)
AST: 15 U/L (ref 15–40)
Albumin/Globulin Ratio: 1.5
Albumin: 4.4 g/dL (ref 3.5–5.7)
Alk Phosphatase: 72 U/L (ref 34–104)
Anion Gap: 6 mmol/L (ref 3–16)
BUN/Creatinine Ratio: 18
BUN: 16 mg/dL (ref 7–25)
CO2: 30 mmol/L (ref 20–32)
Calcium: 9.5 mg/dL (ref 8.6–10.3)
Chloride: 104 mmol/L (ref 98–107)
Creatinine: 0.87 mg/dL (ref 0.7–1.3)
Est, Glom Filt Rate: >90 mL/min/{1.73_m2}
Globulin: 3 g/dL
Glucose: 87 mg/dL (ref 70–99)
Potassium: 4.7 mmol/L — ABNORMAL HIGH (ref 3.4–4.5)
Sodium: 140 mmol/L (ref 136–145)
Total Bilirubin: 0.4 mg/dL (ref 0.1–1.2)
Total Protein: 7.4 g/dL (ref 6.0–8.3)

## 2024-02-08 MED ORDER — HYDROXYZINE HCL 25 MG PO TABS
25 | ORAL_TABLET | ORAL | 0 refills | Status: DC
Start: 2024-02-08 — End: 2024-02-22

## 2024-02-08 NOTE — Assessment & Plan Note (Signed)
 At goal, tolerating lisinopril well.  Continue current dose.  Follow-up 3 months for CP
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
## 2024-02-09 LAB — RPR: RPR: NONREACTIVE

## 2024-02-09 NOTE — Telephone Encounter (Signed)
 Labs so far are pretty normal will call him when all labs are back       Spoke with pt, aware of results and understood.

## 2024-02-09 NOTE — Telephone Encounter (Signed)
 Scott ok to advise of results>

## 2024-02-09 NOTE — Telephone Encounter (Signed)
 Pt called asking if the results for the HIV test is back. I advised that the lab is still in process so we do not have the results as of right now, but we are working on them.    Please reach out to pt with results: [phone]

## 2024-02-09 NOTE — Telephone Encounter (Signed)
 Pt called because he wants someone to go over his RPR results with him. Please advice

## 2024-02-10 LAB — HEPATITIS PANEL, ACUTE
Hep A IgM: NEGATIVE
Hep B Core Ab, IgM: NEGATIVE
Hepatitis B Surface Ag: NEGATIVE
Hepatitis C Ab: NEGATIVE

## 2024-02-10 LAB — HIV 1/2 AG/AB COMBO: HIV 1/2 Ag And Ab Screen: NEGATIVE

## 2024-02-22 NOTE — Telephone Encounter (Signed)
 Patient's name and date of birth verified at start of call.   Incoming refill request.  Caller has been notified that we require a minimum of 2 business days for refill processing. (This does not include weekends, holidays, etc.)      Jerry Leonard  1996-08-18      Confirmed best contact number:   Home Phone [phone] (home)    Medications Requested:  Requested Prescriptions     Pending Prescriptions Disp Refills    hydrOXYzine HCl (ATARAX) 25 MG tablet 60 tablet 0     Sig: 1-2 po qhs prn insomnia       Preferred Pharmacy:   [id number] - [place], [place] - [address] - P [phone] Carmon Ginsberg [phone]  [address]  [place] [address]  Phone: [phone] Fax: [phone]    Has patient already contacted pharmacy to confirm no refills were on file: Yes    Notes for office regarding medication request:    Pt had trouble with pharmacy, swapped back to [facility] and would like it sent there.       Other instructions and notes:  Last visit with provider: 02/08/2024  Next scheduled visit with provider: 05/23/2024  *If next visit is not scheduled, book next needed visit or document if recall was added to list prior to sending for processing*  Inform patient that this needs to be on file before sending request as we do require for them to remain up-to-date with their recommended healthcare in order to avoid any interruptions in our ability to provide ongoing care such as refills.     Patient MyChart Status:  For Active Patients - Patient has been notified that they will receive an automated notification via MyChart once their script has been processed.   For Inactive Patients - Patient is aware that we have a patient portal, MyChart, which offers many benefits such as being able to request their refills electronically and receive automated notifications when scripts are processed.  In addition, they can schedule and manage appointments, view their testing results and visit notes, and stay  connected with their care team.  Patient offered MyChart today.  Patient accepted.  (Send link for set up if accepted)

## 2024-02-23 MED ORDER — HYDROXYZINE HCL 25 MG PO TABS
25 | ORAL_TABLET | Freq: Every evening | ORAL | 1 refills | Status: AC | PRN
Start: 2024-02-23 — End: ?

## 2024-02-23 NOTE — Telephone Encounter (Signed)
"  Medication: Vistaril  Last Office Visit: 1 year  Lab Monitoring: None  Category:  Antihistamine  Length of refill: 1 year  Brand Name: Yes  Comments:"  Medications Requested:  Requested Prescriptions     Pending Prescriptions Disp Refills    hydrOXYzine HCl (ATARAX) 25 MG tablet 60 tablet 0     Sig: 1-2 po qhs prn insomnia       Preferred Pharmacy:   [id number] - [place], [place] - [address] - P [phone] - F [phone]  [address]  [place] [address]  Phone: [phone] Fax: [phone]      Date of Last Refill: 02/08/2024    Prescription Refill Protocol reviewed:YES    Allergy List Reviewed and Verified: YES    Possible medication to medication interactions reviewed: YES    Last appt @ PCP Office: 02/08/2024     Future Appointments   Date Time Provider Department Center   05/23/2024  1:30 PM James Ivanoff [place], [place] [facility]       MOST RECENT BLOOD PRESSURES  BP Readings from Last 3 Encounters:   02/08/24 128/78   03/23/23 (!) 150/118   04/15/22 (!) 129/99         MOST RECENT LAB DATA  Lab Results   Component Value Date/Time    K 4.7 02/08/2024 01:31 PM    ALT 15 02/08/2024 01:31 PM    TSH 1.276 04/15/2022 09:59 AM    CHOL 183 02/08/2024 01:31 PM    HGB 13.3 02/08/2024 01:31 PM    HCT 41.0 02/08/2024 01:31 PM

## 2024-04-04 ENCOUNTER — Encounter: Payer: PRIVATE HEALTH INSURANCE | Attending: Physician Assistant | Primary: Physician Assistant

## 2024-05-09 MED ORDER — LISINOPRIL 10 MG PO TABS
10 | ORAL_TABLET | Freq: Every day | ORAL | 0 refills | 90.00000 days | Status: AC
Start: 2024-05-09 — End: ?

## 2024-05-09 NOTE — Telephone Encounter (Signed)
 Pt due for office visit and has one scheduled on  04/3024. Refills sent to get pt to that appointment per protocol.    Medications Requested:  Requested Prescriptions     Pending Prescriptions Disp Refills    lisinopril  (PRINIVIL ;ZESTRIL ) 10 MG tablet [Pharmacy Med Name: LISINOPRIL  10MG  TABLETS] 90 tablet 0     Sig: TAKE 1 TABLET BY MOUTH DAILY       Preferred Pharmacy:   [facility] #[id number] - [place], [place] - [address] - P [phone] - F [phone]  [address]  [place] [address]  Phone: [phone] Fax: [phone]      Date of Last Refill: 01/29/24    Prescription Refill Protocol reviewed:YES    Allergy List Reviewed and Verified: YES    Possible medication to medication interactions reviewed: YES    Last appt @ PCP Office: 02/08/2024     Future Appointments   Date Time Provider Department Center   05/23/2024  1:30 PM Tolbert Fothergill [place], [place] [facility] AM[facility]       MOST RECENT BLOOD PRESSURES  BP Readings from Last 3 Encounters:   02/08/24 128/78   03/23/23 (!) 150/118   04/15/22 (!) 129/99         MOST RECENT LAB DATA  Lab Results   Component Value Date/Time    K 4.7 02/08/2024 01:31 PM    ALT 15 02/08/2024 01:31 PM    TSH 1.276 04/15/2022 09:59 AM    CHOL 183 02/08/2024 01:31 PM    HGB 13.3 02/08/2024 01:31 PM    HCT 41.0 02/08/2024 01:31 PM

## 2024-05-09 NOTE — Telephone Encounter (Signed)
 Patient's name and date of birth verified at start of call.   Incoming refill request.  Caller has been notified that we require a minimum of 2 business days for refill processing. (This does not include weekends, holidays, etc.)      Jerry Leonard [id number]  [birth year]      Confirmed best contact number:   Home Phone [phone] (home)    Medications Requested:  Requested Prescriptions     Pending Prescriptions Disp Refills    lisinopril  (PRINIVIL ;ZESTRIL ) 10 MG tablet [Pharmacy Med Name: LISINOPRIL  10MG  TABLETS] 90 tablet 0     Sig: TAKE 1 TABLET BY MOUTH DAILY       Preferred Pharmacy:   [facility] #[id number] - [place], [place] - [address] - P [phone] [name] [phone]  [address]  [place] [address]  Phone: [phone] Fax: [phone]    Has patient already contacted pharmacy to confirm no refills were on file: Yes    Notes for office regarding medication request:    Patient is out!     Other instructions and notes:  Last visit with provider: 02/08/2024  Next scheduled visit with provider: 05/23/2024  *If next visit is not scheduled, book next needed visit or document if recall was added to list prior to sending for processing*  Inform patient that this needs to be on file before sending request as we do require for them to remain up-to-date with their recommended healthcare in order to avoid any interruptions in our ability to provide ongoing care such as refills.     Patient MyChart Status:  For Active Patients - Patient has been notified that they will receive an automated notification via MyChart once their script has been processed.   For Inactive Patients - Patient is aware that we have a patient portal, MyChart, which offers many benefits such as being able to request their refills electronically and receive automated notifications when scripts are processed.  In addition, they can schedule and manage appointments, view their testing results and visit notes, and stay connected with their  care team.  Patient offered MyChart today.  Patient accepted.  (Send link for set up if accepted)

## 2024-05-23 ENCOUNTER — Ambulatory Visit: Payer: PRIVATE HEALTH INSURANCE | Attending: Physician Assistant | Primary: Physician Assistant

## 2024-06-09 MED ORDER — LISINOPRIL 10 MG PO TABS
10 | ORAL_TABLET | Freq: Every day | ORAL | 2 refills | 30.00000 days | Status: AC
Start: 2024-06-09 — End: ?

## 2024-06-09 NOTE — Telephone Encounter (Signed)
 Medications Requested:.  Last potassium 4.7- IN range   Filled   ACE Inhibitors Protocol Failed07/17/2025 10:32 AM   Protocol Details Last potassium level normal, within the past 12 months    Last creatinine level resulted within the past 12 months    Visit with authorizing provider in past 9 months or upcoming 90 days       To be filled at: [facility] #[id number] - [place], [place] - [address] - P [phone] - F [phone]     Requested Prescriptions     Pending Prescriptions Disp Refills    lisinopril  (PRINIVIL ;ZESTRIL ) 10 MG tablet [Pharmacy Med Name: LISINOPRIL  10MG  TABLETS] 90 tablet 0     Sig: TAKE 1 TABLET BY MOUTH DAILY       Preferred Pharmacy:   [facility] #[id number] - [place], [place] - [address] - P [phone] GLENWOOD FALCON [phone]  [address]  [place] [address]  Phone: [phone] Fax: [phone]      Date of Last Refill: 05/09/2024    Prescription Refill Protocol reviewed: Yes    Allergy List Reviewed and Verified: Yes    Possible medication to medication interactions reviewed: Yes    Last appt @ PCP Office: 02/08/2024     No future appointments.    MOST RECENT BLOOD PRESSURES  BP Readings from Last 3 Encounters:   02/08/24 128/78   03/23/23 (!) 150/118   04/15/22 (!) 129/99         MOST RECENT LAB DATA  Lab Results   Component Value Date/Time    K 4.7 02/08/2024 01:31 PM    ALT 15 02/08/2024 01:31 PM    TSH 1.276 04/15/2022 09:59 AM    CHOL 183 02/08/2024 01:31 PM    HGB 13.3 02/08/2024 01:31 PM    HCT 41.0 02/08/2024 01:31 PM

## 2024-06-21 NOTE — Telephone Encounter (Signed)
 when Jerry Leonard calls back, please reschedule his No Show from 06/30    sending letter to home address to reschedule    spoke to Jerry Leonard and he will call and reschedule next week  ghb -06/21/2024    No Show letter mailed  No Showed appointment  Will need to reschedule Medication Appointment for refills  [facility] - 05/23/2024  mb is full cannot accept msges 929 6/27/sb
